# Patient Record
Sex: Male | Born: 1985 | Race: Black or African American | Hispanic: No | Marital: Single | Smoking: Current every day smoker
Health system: Southern US, Community
[De-identification: ages and names within clinical notes are randomized; demographics above are authoritative.]

## PROBLEM LIST (undated history)

## (undated) DIAGNOSIS — J45909 Unspecified asthma, uncomplicated: Secondary | ICD-10-CM

## (undated) DIAGNOSIS — F319 Bipolar disorder, unspecified: Secondary | ICD-10-CM

## (undated) DIAGNOSIS — F32A Depression, unspecified: Secondary | ICD-10-CM

## (undated) DIAGNOSIS — F329 Major depressive disorder, single episode, unspecified: Secondary | ICD-10-CM

---
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
## 2007-03-22 ENCOUNTER — Inpatient Hospital Stay: Payer: Self-pay | Admitting: Internal Medicine

## 2007-03-22 ENCOUNTER — Other Ambulatory Visit: Payer: Self-pay

## 2007-10-24 ENCOUNTER — Emergency Department: Payer: Self-pay | Admitting: Emergency Medicine

## 2011-01-13 ENCOUNTER — Emergency Department: Payer: Self-pay | Admitting: Emergency Medicine

## 2011-10-17 ENCOUNTER — Emergency Department: Payer: Self-pay | Admitting: Unknown Physician Specialty

## 2011-10-17 LAB — DRUG SCREEN, URINE
Amphetamines, Ur Screen: NEGATIVE (ref ?–1000)
Barbiturates, Ur Screen: NEGATIVE (ref ?–200)
Benzodiazepine, Ur Scrn: NEGATIVE (ref ?–200)
Cocaine Metabolite,Ur ~~LOC~~: NEGATIVE (ref ?–300)
MDMA (Ecstasy)Ur Screen: NEGATIVE (ref ?–500)
Methadone, Ur Screen: NEGATIVE (ref ?–300)
Phencyclidine (PCP) Ur S: POSITIVE (ref ?–25)
Tricyclic, Ur Screen: NEGATIVE (ref ?–1000)

## 2011-10-17 LAB — CBC
MCH: 30.1 pg (ref 26.0–34.0)
MCV: 89 fL (ref 80–100)
Platelet: 246 10*3/uL (ref 150–440)
RDW: 13.1 % (ref 11.5–14.5)
WBC: 8.4 10*3/uL (ref 3.8–10.6)

## 2011-10-17 LAB — URINALYSIS, COMPLETE
Bilirubin,UR: NEGATIVE
Glucose,UR: NEGATIVE mg/dL (ref 0–75)
Ketone: NEGATIVE
Nitrite: NEGATIVE
RBC,UR: NONE SEEN /HPF (ref 0–5)
Squamous Epithelial: NONE SEEN
WBC UR: <1 /HPF (ref 0–5)

## 2011-10-17 LAB — COMPREHENSIVE METABOLIC PANEL
Albumin: 4.5 g/dL (ref 3.4–5.0)
Alkaline Phosphatase: 63 U/L (ref 50–136)
Anion Gap: 11 (ref 7–16)
BUN: 5 mg/dL — ABNORMAL LOW (ref 7–18)
Bilirubin,Total: 0.8 mg/dL (ref 0.2–1.0)
Calcium, Total: 9.2 mg/dL (ref 8.5–10.1)
Co2: 22 mmol/L (ref 21–32)
Creatinine: 0.62 mg/dL (ref 0.60–1.30)
EGFR (Non-African Amer.): >60
Osmolality: 276 (ref 275–301)
Potassium: 4.1 mmol/L (ref 3.5–5.1)
SGOT(AST): 61 U/L — ABNORMAL HIGH (ref 15–37)
SGPT (ALT): 51 U/L
Sodium: 140 mmol/L (ref 136–145)
Total Protein: 8 g/dL (ref 6.4–8.2)

## 2011-10-17 LAB — CK TOTAL AND CKMB (NOT AT ARMC): CK-MB: 3.3 ng/mL (ref 0.5–3.6)

## 2011-10-17 LAB — ETHANOL
Ethanol %: 0.228 % — ABNORMAL HIGH (ref 0.000–0.080)
Ethanol: 228 mg/dL

## 2012-06-17 ENCOUNTER — Emergency Department: Payer: Self-pay | Admitting: Emergency Medicine

## 2013-02-02 ENCOUNTER — Emergency Department: Payer: Self-pay | Admitting: Emergency Medicine

## 2013-04-06 ENCOUNTER — Emergency Department: Payer: Self-pay | Admitting: Emergency Medicine

## 2014-09-15 ENCOUNTER — Emergency Department: Payer: Self-pay | Admitting: Emergency Medicine

## 2015-01-25 ENCOUNTER — Emergency Department (HOSPITAL_COMMUNITY): Payer: Self-pay

## 2015-01-25 ENCOUNTER — Emergency Department (HOSPITAL_COMMUNITY)
Admission: EM | Admit: 2015-01-25 | Discharge: 2015-01-25 | Disposition: A | Discharge status: Home / Self Care | Payer: Self-pay | Attending: Emergency Medicine | Admitting: Emergency Medicine

## 2015-01-25 ENCOUNTER — Encounter (HOSPITAL_COMMUNITY): Payer: Self-pay | Admitting: Emergency Medicine

## 2015-01-25 DIAGNOSIS — J45901 Unspecified asthma with (acute) exacerbation: Secondary | ICD-10-CM | POA: Insufficient documentation

## 2015-01-25 HISTORY — DX: Unspecified asthma, uncomplicated: J45.909

## 2015-01-25 MED ORDER — PREDNISONE 20 MG PO TABS: 40.0000 mg | ORAL_TABLET | Freq: Every day | ORAL | Status: DC

## 2015-01-25 MED ORDER — PREDNISONE 20 MG PO TABS
60.0000 mg | ORAL_TABLET | Freq: Once | ORAL | Status: AC
  Administered 2015-01-25: 60 mg via ORAL
  Filled 2015-01-25: qty 3

## 2015-01-25 MED ORDER — ALBUTEROL SULFATE HFA 108 (90 BASE) MCG/ACT IN AERS
2.0000 | INHALATION_SPRAY | RESPIRATORY_TRACT | Status: DC | PRN
  Administered 2015-01-25: 2 via RESPIRATORY_TRACT
  Filled 2015-01-25: qty 6.7

## 2015-01-25 MED ORDER — IPRATROPIUM-ALBUTEROL 0.5-2.5 (3) MG/3ML IN SOLN
3.0000 mL | Freq: Once | RESPIRATORY_TRACT | Status: AC
  Administered 2015-01-25: 3 mL via RESPIRATORY_TRACT
  Filled 2015-01-25: qty 3

## 2015-01-25 NOTE — ED Notes (Signed)
Pt reports cough last night. 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 States no inhalers. Pt noted to have inspiratory and expiratory wheezing.

## 2015-01-25 NOTE — Discharge Instructions (Signed)

## 2015-01-25 NOTE — ED Provider Notes (Signed)
CSN: [id number]     Arrival date & time 01/25/15  0930 History  This chart was scribed for non-physician practitioner, Teressa Lower, NP, working with Mancel Bale, MD, by Ronney Lion, ED Scribe. This patient was seen in room TR09C/TR09C and the patient's care was started at 9:41 AM.    No chief complaint on file.  The history is provided by the patient. No language interpreter was used.    HPI Comments: Omar [name] is a 29 y.o. male who presents to the Emergency Department complaining of a possible asthma exacerbation. Patient was at work when he started to have a severe coughing fit accompanied by sweating. He mentions that he also had a constant cough over the past 2 days. He states that beads of sweat form on his head whenever he has a coughing fit. Patient doesn't have an inhaler, but he states he did go to the hospital 2 months ago, where he was told he had acute asthma and was given a breathing treatment. Patient works with [employer] in [place], [place]. He denies any known fever.  No past medical history on file. No past surgical history on file. No family history on file. History  Substance Use Topics  . Smoking status: Not on file  . Smokeless tobacco: Not on file  . Alcohol Use: Not on file    Review of Systems  Constitutional: Positive for diaphoresis. Negative for fever.  Respiratory: Positive for cough.   All other systems reviewed and are negative.     Allergies  Review of patient's allergies indicates not on file.  Home Medications   Prior to Admission medications   Not on File   There were no vitals taken for this visit. Physical Exam  Constitutional: He is oriented to person, place, and time. He appears well-developed and well-nourished. No distress.  HENT:  Head: Normocephalic and atraumatic.  Right Ear: External ear normal.  Left Ear: External ear normal.  Mouth/Throat: Oropharynx is clear and moist.  Eyes: Conjunctivae and EOM are normal.  Neck:  Neck supple. No tracheal deviation present.  Cardiovascular: Normal rate.   Pulmonary/Chest: Effort normal. No respiratory distress. He has wheezes.  Musculoskeletal: Normal range of motion.  Neurological: He is alert and oriented to person, place, and time.  Skin: Skin is warm and dry.  Psychiatric: He has a normal mood and affect. His behavior is normal.  Nursing note and vitals reviewed.   ED Course  Procedures (including critical care time)  DIAGNOSTIC STUDIES: Oxygen Saturation is 97% on RA, noraml by my interpretation.    COORDINATION OF CARE: 9:42 AM - Discussed treatment plan with pt at bedside which includes breathing treatment and CXR, and pt agreed to plan.   Labs Review Labs Reviewed - No data to display  Imaging Review Dg Chest 2 View  01/25/2015   CLINICAL DATA:  Asthma  EXAM: CHEST  2 VIEW  COMPARISON:  None.  FINDINGS: Cardiomediastinal silhouette is unremarkable. No acute infiltrate or pleural effusion. No pulmonary edema. Bony thorax is unremarkable.  IMPRESSION: No active cardiopulmonary disease.   Electronically Signed   By: Natasha Mead M.D.   On: 01/25/2015 09:57     EKG Interpretation None      MDM   Final diagnoses:  Asthma exacerbation   Pt is doing better after treatment.no infection noted on x-ray. Pt sent home on prednisone and given and inhaler.   I personally performed the services described in this documentation, which was scribed in my  presence. The recorded information has been reviewed and is accurate.     Teressa Lower, NP 01/25/15 1040  Mancel Bale, MD 01/26/15 [phone]

## 2015-03-07 ENCOUNTER — Emergency Department (INDEPENDENT_AMBULATORY_CARE_PROVIDER_SITE_OTHER)
Admission: EM | Admit: 2015-03-07 | Discharge: 2015-03-07 | Disposition: A | Discharge status: Home / Self Care | Payer: Self-pay | Source: Home / Self Care | Attending: Emergency Medicine | Admitting: Emergency Medicine

## 2015-03-07 ENCOUNTER — Encounter (HOSPITAL_COMMUNITY): Payer: Self-pay | Admitting: Emergency Medicine

## 2015-03-07 DIAGNOSIS — J45901 Unspecified asthma with (acute) exacerbation: Secondary | ICD-10-CM

## 2015-03-07 MED ORDER — IPRATROPIUM-ALBUTEROL 0.5-2.5 (3) MG/3ML IN SOLN
RESPIRATORY_TRACT | Status: AC
  Filled 2015-03-07: qty 3

## 2015-03-07 MED ORDER — ALBUTEROL SULFATE HFA 108 (90 BASE) MCG/ACT IN AERS
INHALATION_SPRAY | RESPIRATORY_TRACT | Status: AC
  Filled 2015-03-07: qty 6.7

## 2015-03-07 MED ORDER — CETIRIZINE HCL 10 MG PO TABS: 10.0000 mg | ORAL_TABLET | Freq: Every day | ORAL | Status: DC

## 2015-03-07 MED ORDER — ALBUTEROL SULFATE HFA 108 (90 BASE) MCG/ACT IN AERS
2.0000 | INHALATION_SPRAY | Freq: Once | RESPIRATORY_TRACT | Status: AC
  Administered 2015-03-07: 2 via RESPIRATORY_TRACT

## 2015-03-07 MED ORDER — IPRATROPIUM-ALBUTEROL 0.5-2.5 (3) MG/3ML IN SOLN
3.0000 mL | Freq: Once | RESPIRATORY_TRACT | Status: AC
  Administered 2015-03-07: 3 mL via RESPIRATORY_TRACT

## 2015-03-07 MED ORDER — PREDNISONE 50 MG PO TABS: ORAL_TABLET | ORAL | Status: DC

## 2015-03-07 NOTE — Discharge Instructions (Signed)
You have asthma. It seems like animal dander is a trigger for her asthma. Take prednisone 1 pill daily for the next 5 days. Take Zyrtec 1 pill daily for the next month. Use the albuterol inhaler every 4 hours as needed for wheezing or cough. Follow-up as needed.

## 2015-03-07 NOTE — ED Provider Notes (Signed)
CSN: [id number]     Arrival date & time 03/07/15  1840 History   First MD Initiated Contact with Patient 03/07/15 2008     Chief Complaint  Patient presents with  . Asthma   (Consider location/radiation/quality/duration/timing/severity/associated sxs/prior Treatment) HPI He is a 29 year old man here for evaluation of asthma. He states he is diagnosed with asthma about 2 months ago. At that time he was given an albuterol inhaler. He states for the last 9 months he has had frequent coughing. He denies frank shortness of breath or wheezing. He works at an [employer], and states most of his coughing spells happen at work. About a month ago, he had another asthma attack was seen in the emergency room. That time he was placed on prednisone and given another inhaler. Today, he has had frequent coughing at work. He is here because he is out of his albuterol. He also reports that after an asthma attack at work, he will sometimes have vomiting with eating.  Past Medical History  Diagnosis Date  . Asthma    History reviewed. No pertinent past surgical history. History reviewed. No pertinent family history. History  Substance Use Topics  . Smoking status: Current Every Day Smoker  . Smokeless tobacco: Not on file  . Alcohol Use: No    Review of Systems As in history of present illness Allergies  Review of patient's allergies indicates no known allergies.  Home Medications   Prior to Admission medications   Medication Sig Start Date End Date Taking? Authorizing Provider  cetirizine (ZYRTEC) 10 MG tablet Take 1 tablet (10 mg total) by mouth daily. 03/07/15   Charm Rings, MD  predniSONE (DELTASONE) 50 MG tablet Take 1 pill daily for 5 days. 03/07/15   Charm Rings, MD   BP 124/85 mmHg  Pulse 88  Temp(Src) 97.9 F (36.6 C) (Oral)  Resp 18  SpO2 95% Physical Exam  Constitutional: He is oriented to person, place, and time. He appears well-developed and well-nourished. No distress.  Neck:  Neck supple.  Cardiovascular: Normal rate, regular rhythm and normal heart sounds.   No murmur heard. Pulmonary/Chest: Effort normal. No respiratory distress. He has wheezes (diffuse expiratory wheezes). He has no rales.  Diffuse rhonchi  Neurological: He is alert and oriented to person, place, and time.    ED Course  Procedures (including critical care time) Labs Review Labs Reviewed - No data to display  Imaging Review No results found.   MDM   1. Asthma exacerbation    DuoNeb 2.5-0.5 mg given. Albuterol MDI 2 puffs given.  Lungs are much improved. He has a rare wheeze. I suspect animal dander is a trigger for his asthma. Discharge with albuterol inhaler. Prescription for prednisone for 5 days. Prescription for cetirizine. Letter for work provided. Follow-up as needed.   Charm Rings, MD 03/07/15 2046

## 2015-04-08 ENCOUNTER — Encounter (HOSPITAL_COMMUNITY): Payer: Self-pay | Admitting: *Deleted

## 2015-04-08 ENCOUNTER — Emergency Department (HOSPITAL_COMMUNITY): Payer: Self-pay

## 2015-04-08 ENCOUNTER — Emergency Department (HOSPITAL_COMMUNITY)
Admission: EM | Admit: 2015-04-08 | Discharge: 2015-04-08 | Disposition: A | Discharge status: Home / Self Care | Payer: Self-pay | Attending: Emergency Medicine | Admitting: Emergency Medicine

## 2015-04-08 DIAGNOSIS — J45909 Unspecified asthma, uncomplicated: Secondary | ICD-10-CM | POA: Insufficient documentation

## 2015-04-08 DIAGNOSIS — B349 Viral infection, unspecified: Secondary | ICD-10-CM | POA: Insufficient documentation

## 2015-04-08 DIAGNOSIS — Z79899 Other long term (current) drug therapy: Secondary | ICD-10-CM | POA: Insufficient documentation

## 2015-04-08 DIAGNOSIS — Z720 Tobacco use: Secondary | ICD-10-CM | POA: Insufficient documentation

## 2015-04-08 LAB — COMPREHENSIVE METABOLIC PANEL
ALBUMIN: 4.2 g/dL (ref 3.5–5.0)
ALT: 33 U/L (ref 17–63)
ANION GAP: 13 (ref 5–15)
AST: 48 U/L — AB (ref 15–41)
Alkaline Phosphatase: 71 U/L (ref 38–126)
BILIRUBIN TOTAL: 1.1 mg/dL (ref 0.3–1.2)
BUN: 13 mg/dL (ref 6–20)
CALCIUM: 8.8 mg/dL — AB (ref 8.9–10.3)
CO2: 19 mmol/L — ABNORMAL LOW (ref 22–32)
Chloride: 99 mmol/L — ABNORMAL LOW (ref 101–111)
Creatinine, Ser: 1.12 mg/dL (ref 0.61–1.24)
Glucose, Bld: 128 mg/dL — ABNORMAL HIGH (ref 65–99)
Potassium: 3.3 mmol/L — ABNORMAL LOW (ref 3.5–5.1)
SODIUM: 131 mmol/L — AB (ref 135–145)
TOTAL PROTEIN: 7.2 g/dL (ref 6.5–8.1)

## 2015-04-08 LAB — CBC
HEMATOCRIT: 44.5 % (ref 39.0–52.0)
Hemoglobin: 15 g/dL (ref 13.0–17.0)
MCH: 27.9 pg (ref 26.0–34.0)
MCHC: 33.7 g/dL (ref 30.0–36.0)
MCV: 82.9 fL (ref 78.0–100.0)
Platelets: 170 10*3/uL (ref 150–400)
RBC: 5.37 MIL/uL (ref 4.22–5.81)
RDW: 12.8 % (ref 11.5–15.5)
WBC: 5.7 10*3/uL (ref 4.0–10.5)

## 2015-04-08 LAB — URINALYSIS, ROUTINE W REFLEX MICROSCOPIC
Bilirubin Urine: NEGATIVE
Glucose, UA: NEGATIVE mg/dL
Leukocytes, UA: NEGATIVE
Nitrite: NEGATIVE
PROTEIN: 30 mg/dL — AB
Specific Gravity, Urine: 1.025 (ref 1.005–1.030)
UROBILINOGEN UA: 0.2 mg/dL (ref 0.0–1.0)
pH: 5.5 (ref 5.0–8.0)

## 2015-04-08 LAB — URINE MICROSCOPIC-ADD ON

## 2015-04-08 LAB — I-STAT CG4 LACTIC ACID, ED
LACTIC ACID, VENOUS: 0.93 mmol/L (ref 0.5–2.0)
LACTIC ACID, VENOUS: 0.41 mmol/L — AB (ref 0.5–2.0)

## 2015-04-08 MED ORDER — DEXTROSE 5 % IV SOLN
1.0000 g | Freq: Once | INTRAVENOUS | Status: AC
  Administered 2015-04-08: 1 g via INTRAVENOUS
  Filled 2015-04-08: qty 10

## 2015-04-08 MED ORDER — SODIUM CHLORIDE 0.9 % IV BOLUS (SEPSIS)
1000.0000 mL | Freq: Once | INTRAVENOUS | Status: AC
  Administered 2015-04-08: 1000 mL via INTRAVENOUS

## 2015-04-08 MED ORDER — ACETAMINOPHEN 325 MG PO TABS
650.0000 mg | ORAL_TABLET | Freq: Once | ORAL | Status: AC | PRN
  Administered 2015-04-08: 650 mg via ORAL
  Filled 2015-04-08: qty 2

## 2015-04-08 MED ORDER — PROMETHAZINE HCL 25 MG PO TABS: 25.0000 mg | ORAL_TABLET | Freq: Four times a day (QID) | ORAL | Status: DC | PRN

## 2015-04-08 MED ORDER — ALBUTEROL SULFATE HFA 108 (90 BASE) MCG/ACT IN AERS
2.0000 | INHALATION_SPRAY | RESPIRATORY_TRACT | Status: DC | PRN
  Administered 2015-04-08: 2 via RESPIRATORY_TRACT
  Filled 2015-04-08: qty 6.7

## 2015-04-08 NOTE — ED Provider Notes (Signed)
CSN: [id number]     Arrival date & time 04/08/15  1219 History   First MD Initiated Contact with Patient 04/08/15 1234     Chief Complaint  Patient presents with  . Cough  . Fever     (Consider location/radiation/quality/duration/timing/severity/associated sxs/prior Treatment) Patient is a 29 y.o. Omar [name] presenting with cough and fever. The history is provided by the patient.  Cough Cough characteristics:  Productive Sputum characteristics:  Yellow Associated symptoms: fever, headaches and sore throat   Fever Associated symptoms: cough, diarrhea, headaches, nausea, sore throat and vomiting    for the last few days patient has had fever with some production. States he's felt weak all over. Also has had nausea vomiting diarrhea. No definite sick contacts. States he is vomited and had watery diarrhea. He has a dull headache. States he feels fatigued all over. He is otherwise healthy. He does smoke. No dysuria.  Past Medical History  Diagnosis Date  . Asthma    History reviewed. No pertinent past surgical history. History reviewed. No pertinent family history. Social History  Substance Use Topics  . Smoking status: Current Every Day Smoker  . Smokeless tobacco: None  . Alcohol Use: No    Review of Systems  Constitutional: Positive for fever and appetite change.  HENT: Positive for sore throat.   Respiratory: Positive for cough.   Gastrointestinal: Positive for nausea, vomiting and diarrhea. Negative for abdominal pain.  Genitourinary: Negative for flank pain.  Musculoskeletal: Negative for back pain.  Skin: Negative for wound.  Neurological: Positive for headaches.  Hematological: Negative for adenopathy.      Allergies  Review of patient's allergies indicates no known allergies.  Home Medications   Prior to Admission medications   Medication Sig Start Date End Date Taking? Authorizing Provider  albuterol (PROVENTIL HFA;VENTOLIN HFA) 108 (90 BASE) MCG/ACT inhaler  Inhale 1-2 puffs into the lungs every 4 (four) hours as needed for wheezing or shortness of breath.   Yes Historical Provider, MD  cetirizine (ZYRTEC) 10 MG tablet Take 1 tablet (10 mg total) by mouth daily. 03/07/15  Yes Charm Rings, MD  ibuprofen (ADVIL,MOTRIN) 200 MG tablet Take 400 mg by mouth every 6 (six) hours as needed (pain).   Yes Historical Provider, MD   BP 119/62 mmHg  Pulse 101  Temp(Src) 99.1 F (37.3 C) (Oral)  Resp 18  SpO2 97% Physical Exam  Constitutional: He is oriented to person, place, and time. He appears well-developed and well-nourished.  HENT:  Head: Atraumatic.  Slight posterior pharyngeal erythema without exudate.  Neck: Neck supple.  No meningeal signs  Cardiovascular:  Moderate tachycardia  Pulmonary/Chest: Effort normal.  Few scattered wheezes on right and couple rhonchi on the base on the left.  Abdominal: Soft. There is no tenderness.  Neurological: He is alert and oriented to person, place, and time.  Skin: Skin is warm.    ED Course  Procedures (including critical care time) Labs Review Labs Reviewed  COMPREHENSIVE METABOLIC PANEL - Abnormal; Notable for the following:    Sodium 131 (*)    Potassium 3.3 (*)    Chloride 99 (*)    CO2 19 (*)    Glucose, Bld 128 (*)    Calcium 8.8 (*)    AST 48 (*)    All other components within normal limits  URINALYSIS, ROUTINE W REFLEX MICROSCOPIC (NOT AT [facility]) - Abnormal; Notable for the following:    Hgb urine dipstick TRACE (*)    Ketones, ur >80 (*)  Protein, ur 30 (*)    All other components within normal limits  I-STAT CG4 LACTIC ACID, ED - Abnormal; Notable for the following:    Lactic Acid, Venous 0.41 (*)    All other components within normal limits  URINE CULTURE  CULTURE, BLOOD (ROUTINE X 2)  CULTURE, BLOOD (ROUTINE X 2)  CBC  URINE MICROSCOPIC-ADD ON  I-STAT CG4 LACTIC ACID, ED    Imaging Review Dg Chest 2 View  04/08/2015   CLINICAL DATA:  Cough and shortness of breath for 1  week. History of smoking. History of asthma.  EXAM: CHEST  2 VIEW  COMPARISON:  06/Omar/2016  FINDINGS: The cardiac silhouette, mediastinal and hilar contours are within normal limits and stable. There are mild stable bronchitic type changes, likely related to smoking. No definite infiltrates or effusions. The bony thorax is intact.  IMPRESSION: Mild stable bronchitic changes, likely related to smoking. No infiltrates or effusions.   Electronically Signed   By: Rudie Meyer M.D.   On: 04/08/2015 13:27   I have personally reviewed and evaluated these images and lab results as part of my medical decision-making.   EKG Interpretation   Date/Time:  Friday April 08 2015 12:30:51 EDT Ventricular Rate:  146 PR Interval:  128 QRS Duration: 76 QT Interval:  352 QTC Calculation: 548 R Axis:   93 Text Interpretation:  Sinus tachycardia Rightward axis Abnormal ECG  Confirmed by Tunisha Ruland  MD, Harrold Donath [phone]) on 04/08/2015 12:44:01 PM      MDM   Final diagnoses:  Viral infection    Patient initially came in with a fever 103 and a heart rate of 130. Feels much better after fluids and will give breathing treatment here. Normal lactic acid. Heart rate has come down. Not hypoxic and awaiting. X-ray does not show focal findings on reexam there is just some mild diffuse wheezes. Will give inhaler. Has tolerated also. Will discharge home. Has had nausea vomiting diarrhea also.    Benjiman Core, MD 04/08/15 [phone]

## 2015-04-08 NOTE — ED Notes (Signed)
Patient transported to X-ray 

## 2015-04-08 NOTE — ED Notes (Signed)
Pt reports having a productive cough since Monday with yellow sputum. Reports fever, fatigue, lack of appetite and n/v yesterday. Temp 103.2 at triage, HR 145.

## 2015-04-08 NOTE — Discharge Instructions (Signed)

## 2015-04-09 LAB — URINE CULTURE
Culture: NO GROWTH
Special Requests: NORMAL

## 2015-04-13 LAB — CULTURE, BLOOD (ROUTINE X 2)
Culture: NO GROWTH
Culture: NO GROWTH

## 2016-04-01 IMAGING — CR DG CHEST 2V
2 series · 2 of 2 positions shown · non-contrast
Comparison: 01/25/2015

CLINICAL DATA: Cough and shortness of breath for 1 week. History of
smoking. History of asthma.

EXAM:
CHEST  2 VIEW

[chest pa]
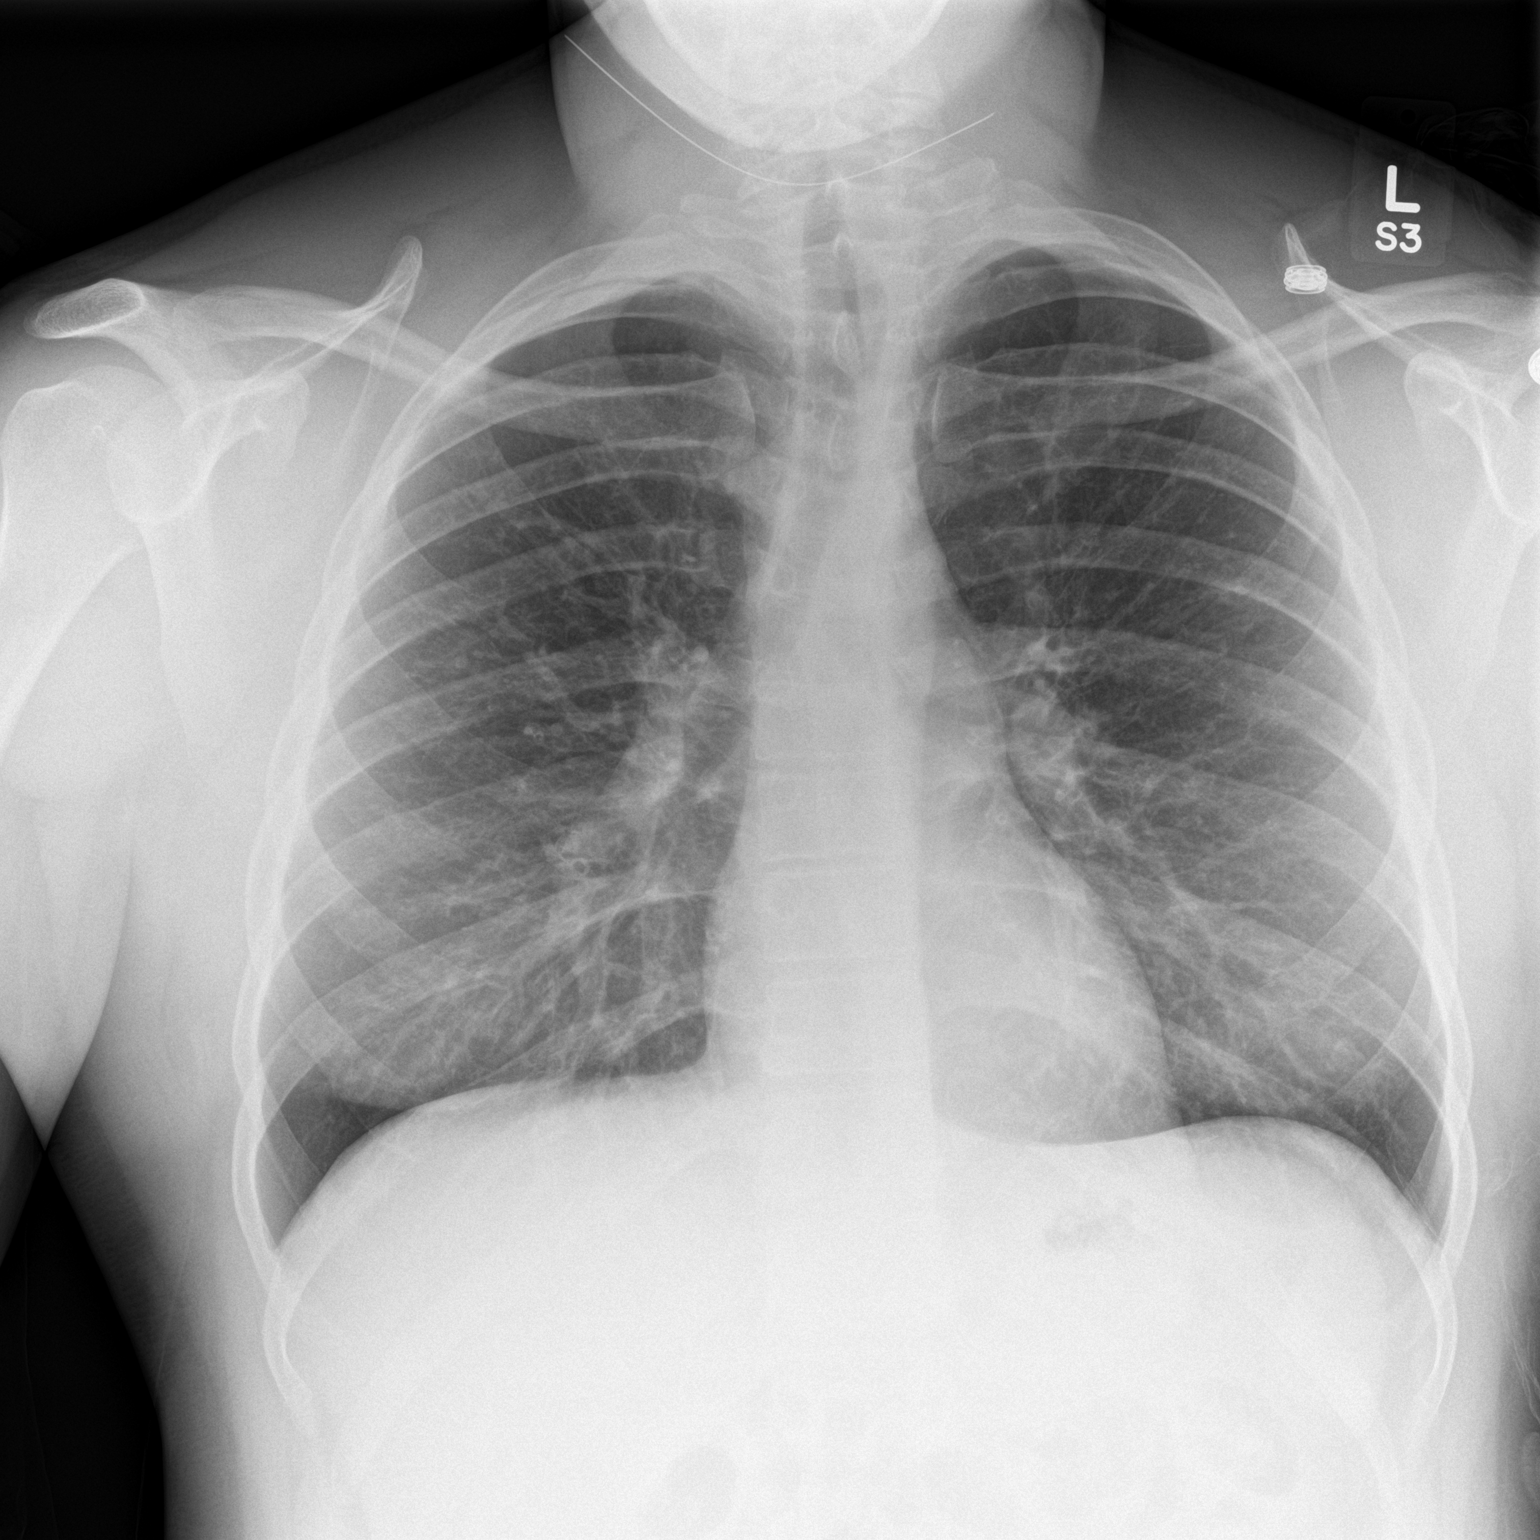

[chest lat]
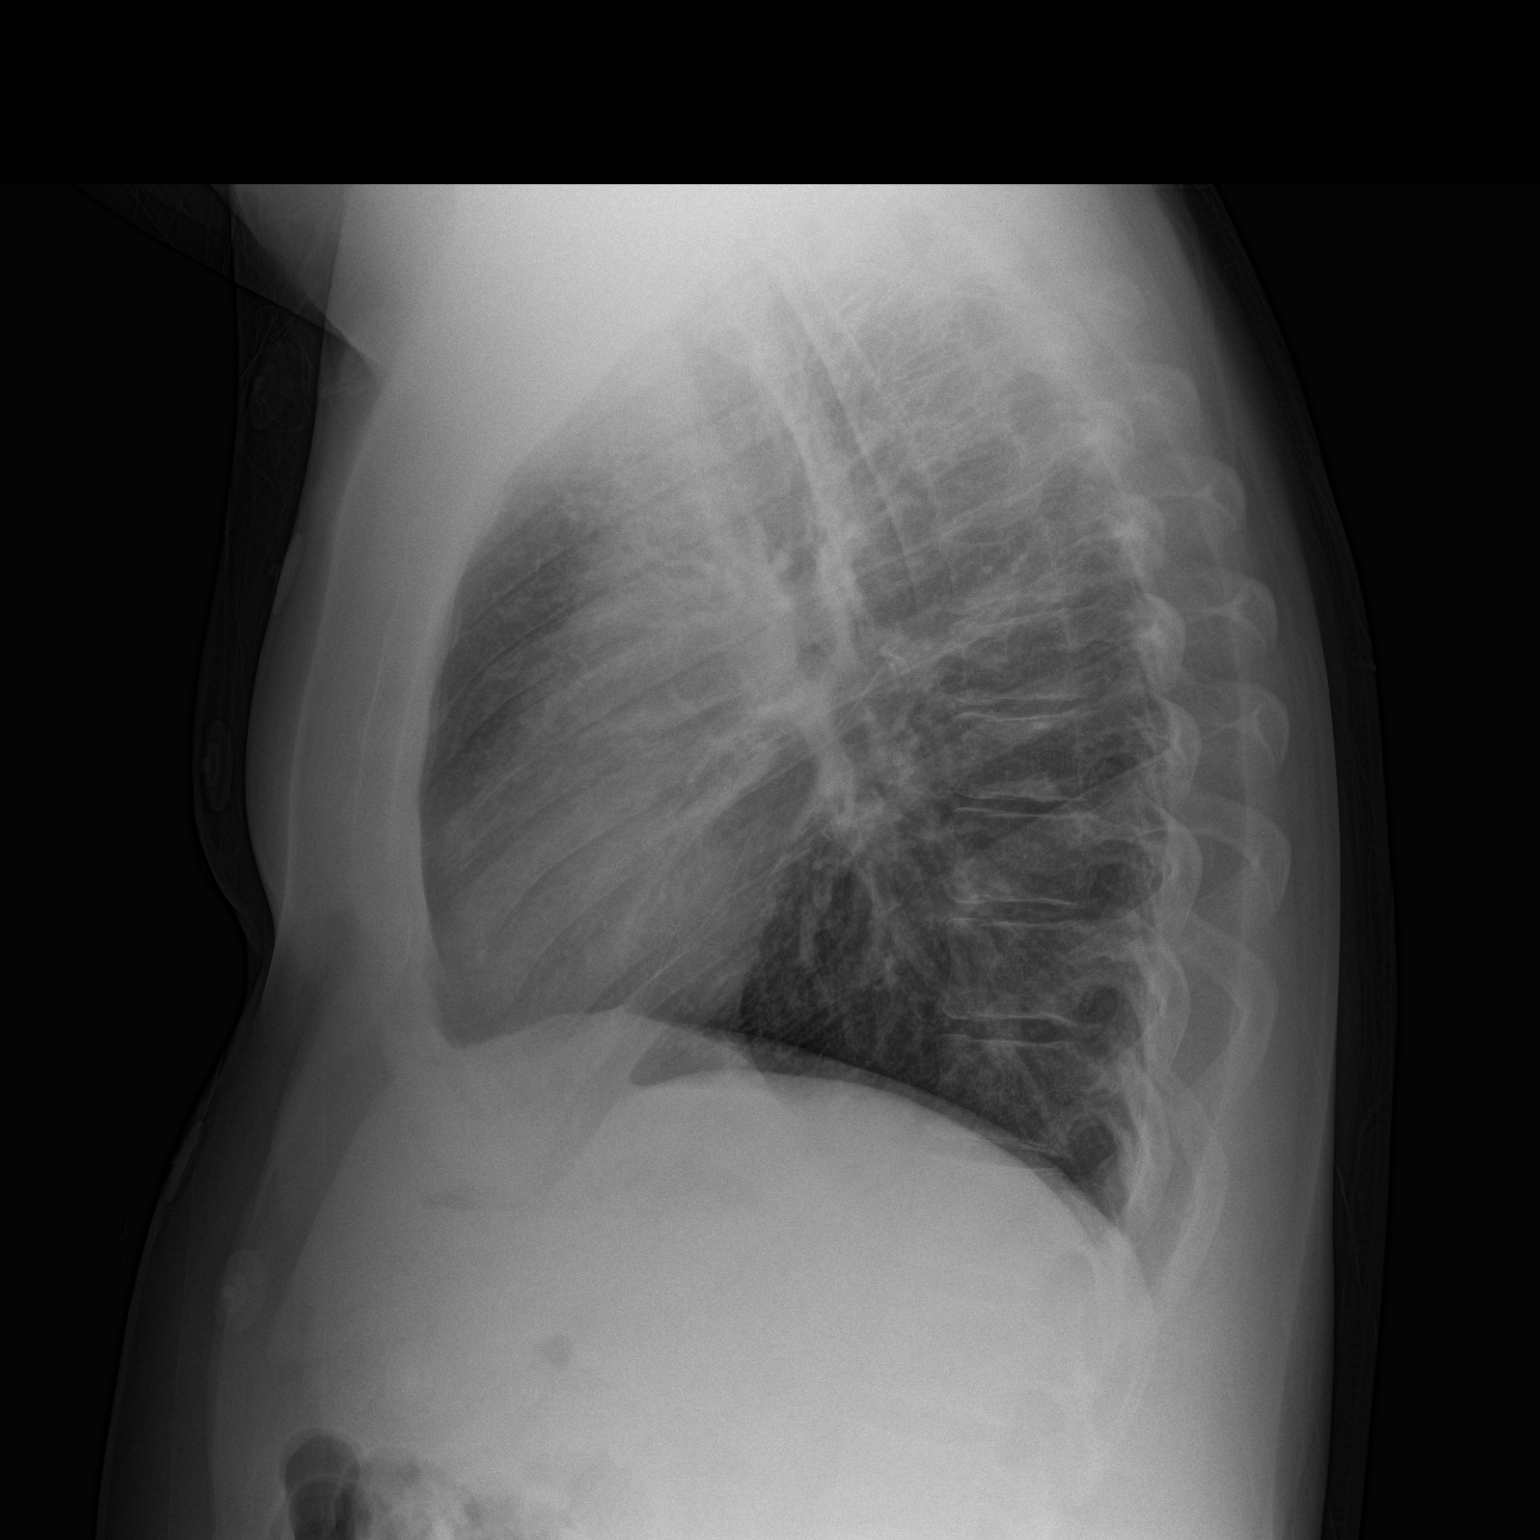

[2 of 2 positions shown; findings below may reference images not displayed]

FINDINGS: The cardiac silhouette, mediastinal and hilar contours are within
normal limits and stable. There are mild stable bronchitic type
changes, likely related to smoking. No definite infiltrates or
effusions. The bony thorax is intact.
IMPRESSION: Mild stable bronchitic changes, likely related to smoking. No
infiltrates or effusions.

## 2018-05-02 ENCOUNTER — Other Ambulatory Visit: Payer: Self-pay

## 2018-05-02 ENCOUNTER — Encounter (HOSPITAL_COMMUNITY): Payer: Self-pay | Admitting: Emergency Medicine

## 2018-05-02 ENCOUNTER — Emergency Department (HOSPITAL_COMMUNITY)
Admission: EM | Admit: 2018-05-02 | Discharge: 2018-05-02 | Disposition: A | Discharge status: Home / Self Care | Payer: Self-pay | Attending: Emergency Medicine | Admitting: Emergency Medicine

## 2018-05-02 DIAGNOSIS — R51 Headache: Secondary | ICD-10-CM | POA: Insufficient documentation

## 2018-05-02 DIAGNOSIS — S0990XA Unspecified injury of head, initial encounter: Secondary | ICD-10-CM

## 2018-05-02 DIAGNOSIS — F17200 Nicotine dependence, unspecified, uncomplicated: Secondary | ICD-10-CM | POA: Insufficient documentation

## 2018-05-02 DIAGNOSIS — R519 Headache, unspecified: Secondary | ICD-10-CM

## 2018-05-02 DIAGNOSIS — Z79899 Other long term (current) drug therapy: Secondary | ICD-10-CM | POA: Insufficient documentation

## 2018-05-02 DIAGNOSIS — Y9389 Activity, other specified: Secondary | ICD-10-CM | POA: Insufficient documentation

## 2018-05-02 DIAGNOSIS — J45909 Unspecified asthma, uncomplicated: Secondary | ICD-10-CM | POA: Insufficient documentation

## 2018-05-02 DIAGNOSIS — S098XXA Other specified injuries of head, initial encounter: Secondary | ICD-10-CM | POA: Insufficient documentation

## 2018-05-02 DIAGNOSIS — Y9289 Other specified places as the place of occurrence of the external cause: Secondary | ICD-10-CM | POA: Insufficient documentation

## 2018-05-02 DIAGNOSIS — Y990 Civilian activity done for income or pay: Secondary | ICD-10-CM | POA: Insufficient documentation

## 2018-05-02 HISTORY — DX: Major depressive disorder, single episode, unspecified: F32.9

## 2018-05-02 HISTORY — DX: Bipolar disorder, unspecified: F31.9

## 2018-05-02 HISTORY — DX: Depression, unspecified: F32.A

## 2018-05-02 MED ORDER — KETOROLAC TROMETHAMINE 15 MG/ML IJ SOLN
15.0000 mg | Freq: Once | INTRAMUSCULAR | Status: AC
  Administered 2018-05-02: 15 mg via INTRAVENOUS
  Filled 2018-05-02: qty 1

## 2018-05-02 MED ORDER — METOCLOPRAMIDE HCL 5 MG/ML IJ SOLN
10.0000 mg | Freq: Once | INTRAMUSCULAR | Status: AC
  Administered 2018-05-02: 10 mg via INTRAVENOUS
  Filled 2018-05-02: qty 2

## 2018-05-02 MED ORDER — SODIUM CHLORIDE 0.9 % IV BOLUS
1000.0000 mL | Freq: Once | INTRAVENOUS | Status: AC
  Administered 2018-05-02: 1000 mL via INTRAVENOUS

## 2018-05-02 MED ORDER — DIPHENHYDRAMINE HCL 12.5 MG/5ML PO ELIX
12.5000 mg | ORAL_SOLUTION | Freq: Once | ORAL | Status: AC
  Administered 2018-05-02: 12.5 mg via ORAL
  Filled 2018-05-02: qty 10

## 2018-05-02 NOTE — ED Notes (Signed)
ED Provider at bedside. 

## 2018-05-02 NOTE — ED Notes (Signed)
Called pt's name for triage for the second time. Triage RN notified.
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
## 2018-05-02 NOTE — ED Notes (Signed)
Pt given turkey sandwich
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
## 2018-05-02 NOTE — Discharge Instructions (Addendum)
Please return to the Emergency Department for any new or worsening symptoms or if your symptoms do not improve. Please be sure to follow up with your Primary Care Physician as soon as possible regarding your visit today. If you do not have a Primary Doctor please use the resources below to establish one.  Contact a doctor if: Your symptoms are not helped by medicine. You have a headache that feels different than the other headaches. You feel sick to your stomach (nauseous) or you throw up (vomit). You have a fever. Get help right away if: Your headache becomes really bad. You keep throwing up. You have a stiff neck. You have trouble seeing. You have trouble speaking. You have pain in the eye or ear. Your muscles are weak or you lose muscle control. You lose your balance or have trouble walking. You feel like you will pass out (faint) or you pass out. You have confusion. Get help right away if: You have: A very bad (severe) headache that is not helped by medicine. Trouble walking or weakness in your arms and legs. Clear or bloody fluid coming from your nose or ears. Changes in your seeing (vision). Jerky movements that you cannot control (seizure). You throw up (vomit). Your symptoms get worse. You lose balance. Your speech is slurred. You pass out. You are sleepier and have trouble staying awake. The black centers of your eyes (pupils) change in size.  RESOURCE GUIDE  Chronic Pain Problems: Contact [facility]  [phone] Patients need to be referred by their primary care doctor.  Insufficient Money for Medicine: Contact United Way:  call "211" or [facility] [phone].  No Primary Care Doctor: Call Health Connect  [phone] - can help you locate a primary care doctor that  accepts your insurance, provides certain services, etc. Physician Referral Service- [phone]  Agencies that provide inexpensive medical care: [facility]  [phone] [facility]  [phone] [facility]  [phone] [facility]  [phone] [facility]  [phone] [facility]  [phone]  Medicaid-accepting [facility] Providers: [facility]- [address]  [phone], Mon-Fri 9am-7pm, Sat 9am-1pm [facility]- [address] [place], [address] [place]  [phone] [facility]- [address] [place]  [phone] [facility]- [address]  [phone] Renaye Rakers- [address] [place], [address], [phone]  Only accepts [place] patients after they have their name  applied to their card  Self Pay (no insurance) in [facility]: Sickle Cell Patients: Dr Willey Blade, [facility]  [address] [place], [phone] [facility]- [address] [place]  [phone]       [facility] [place]- [address]       -     [facility]- see information above (Speak to [employer] if you do not have insurance)       -  [facility]- [address] [place], [phone]       -  [facility]- [address],  [phone]       -  [facility]- [address] [phone]       -  Dr Julio Sicks-  [address], [place], [phone]       -  [facility]- [address], [phone]       -  [facility] [place]- [address], [phone], also [address], [phone]       -    [facility]- [address] [place], [phone], 1st & 3rd Saturday   every month, 10am-1pm  1) Find a Doctor and Pay Out of Pocket Although you won't have to find out who is covered by your insurance plan, it is a good idea to ask around and get recommendations. You will then need to call the office and see if the doctor you have chosen will accept you as a new patient and what types of options they offer for  patients who are self-pay. Some doctors offer discounts or will set up payment plans for their patients who do not have insurance, but you will need to ask so you aren't surprised when you get to your appointment.  2) Contact Your Local Health Department Not all health departments have doctors that can see patients for sick visits, but many do, so it is worth a call to see if yours does. If you don't know where your local health department is, you can check in your phone book. The CDC also has a tool to help you locate your state's health department, and many state websites also have listings of all of their local health departments.  3) Find a Walk-in Clinic If your illness is not likely to be very severe or complicated, you may want to try a walk in clinic. These are popping up all over the country in pharmacies, drugstores, and shopping centers. They're usually staffed by nurse practitioners or physician assistants that have been trained to treat common illnesses and complaints. They're usually fairly quick and inexpensive. However, if you have serious medical issues or chronic medical problems, these are probably not your best option  STD Testing [facility] [place], STD Clinic, [address], [place], phone [phone] or [phone].  Monday - Friday, call for an appointment. [facility] [place], STD Clinic, [place], [place], phone [phone] or [phone].  Monday - Friday, call for an appointment.  Abuse/Neglect: [facility] Child Abuse Hotline [phone](336) (602) 111-1664 [facility] Hotline 559-477-2040905 [phone] (After Hours)  Emergency Shelter:  [facility] [phone](336) [phone]  Maternity Homes: [facility] [phone]) [phone] [facility] [phone]) [phone]  MRSA Hotline #:   303 646 6555671 840 4607  [facility] Resources  [facility] [place]  United Way  [facility] [address]                 [address]         [address]  [place]                                               [place]                              [place] Phone:  [phone]                                  Phone:  [phone]                   Phone:  [place], [place]- [phone]       -     [facility] in [place], [address]                                  [place] [phone] or [phone] (After Hours)   [place]  Substance Abuse Resources: Alcohol and Drug Services  954-709-3216 [place] (732)326-6607 [facility] [place] Chinita Pester (548) 284-0815 Residential & Outpatient Substance Abuse Program  [phone]  Psychological Services: [facility]  [phone] [place]  [place], [place]. [address], [place], [place]: [phone] or [phone], PicCapture.uy  Dental Assistance  If unable to pay or uninsured, contact:  [facility] or [facility]. to become qualified for the adult dental clinic.  Patients with Medicaid: [facility] [phone] [address], [place] [address], [phone]  If unable to pay, or uninsured, contact [facility] [phone]) or [place] [phone] in [place], [place] in [facility]) to become qualified for the adult dental clinic   Other [place], [place], [place], [address], [place], [place], 2nd and 4th Thursday of the month at 6:30am.  10 clients each day by appointment, can sometimes see walk-in patients if someone does not show for an  appointment. [facility]- [address] [place]ard D[place]er [place], [place], [address], [place], [place], [place], [address], [place] Department- [phone] [facility] [phone]

## 2019-04-14 ENCOUNTER — Emergency Department
Admission: EM | Admit: 2019-04-14 | Discharge: 2019-04-14 | Disposition: A | Discharge status: Home / Self Care | Payer: Self-pay | Attending: Emergency Medicine | Admitting: Emergency Medicine

## 2019-04-14 ENCOUNTER — Encounter: Payer: Self-pay | Admitting: Emergency Medicine

## 2019-04-14 ENCOUNTER — Other Ambulatory Visit: Payer: Self-pay

## 2019-04-14 DIAGNOSIS — L0291 Cutaneous abscess, unspecified: Secondary | ICD-10-CM

## 2019-04-14 DIAGNOSIS — L0231 Cutaneous abscess of buttock: Secondary | ICD-10-CM | POA: Insufficient documentation

## 2019-04-14 DIAGNOSIS — F17210 Nicotine dependence, cigarettes, uncomplicated: Secondary | ICD-10-CM | POA: Insufficient documentation

## 2019-04-14 DIAGNOSIS — J45909 Unspecified asthma, uncomplicated: Secondary | ICD-10-CM | POA: Insufficient documentation

## 2019-04-14 DIAGNOSIS — Z79899 Other long term (current) drug therapy: Secondary | ICD-10-CM | POA: Insufficient documentation

## 2019-04-14 MED ORDER — CLINDAMYCIN PHOSPHATE 600 MG/4ML IJ SOLN
600.0000 mg | Freq: Once | INTRAMUSCULAR | Status: AC
  Administered 2019-04-14: 600 mg via INTRAMUSCULAR
  Filled 2019-04-14: qty 4

## 2019-04-14 MED ORDER — CLINDAMYCIN HCL 300 MG PO CAPS: 300.0000 mg | ORAL_CAPSULE | Freq: Four times a day (QID) | ORAL | 0 refills | Status: AC

## 2019-04-14 NOTE — ED Provider Notes (Signed)
[facility] Emergency Department Provider Note  ____________________________________________  Time seen: Approximately 10:44 AM  I have reviewed the triage vital signs and the nursing notes.   HISTORY  Chief Complaint Abscess    HPI Omar [name] is a 33 y.o. male that presents to the the emergency department for evaluation of abscess to left buttocks for 3 days.  Patient states that he thought area was a mosquito bite.  He [facility] over the [facility] and has been applying these. Area has some slight drainage. He is unsure of fever.  He has never had an abscess before.   Past Medical History:  Diagnosis Date  . Asthma   . Bipolar affective (HCC)   . Depression     There are no active problems to display for this patient.   History reviewed. No pertinent surgical history.  Prior to Admission medications   Medication Sig Start Date End Date Taking? Authorizing Provider  albuterol (PROVENTIL HFA;VENTOLIN HFA) 108 (90 BASE) MCG/ACT inhaler Inhale 1-2 puffs into the lungs every 4 (four) hours as needed for wheezing or shortness of breath.    [provider]  clindamycin (CLEOCIN) 300 MG capsule Take 1 capsule (300 mg total) by mouth 4 (four) times daily for 10 days. 04/14/19 04/24/19  Enid DerryWagner, Gisele Pack, PA-C  ibuprofen (ADVIL,MOTRIN) 200 MG tablet Take 400 mg by mouth every 6 (six) hours as needed (pain).    [provider]    Allergies Patient has no known allergies.  No family history on file.  Social History Social History   Tobacco Use  . Smoking status: Current Every Day Smoker  . Smokeless tobacco: Never Used  Substance Use Topics  . Alcohol use: No  . Drug use: Never     Review of Systems  Constitutional: No fever Respiratory: No SOB. Gastrointestinal: No abdominal pain.  No nausea, no vomiting.  Musculoskeletal: Negative for musculoskeletal pain. Skin: Negative for abrasions,  lacerations, ecchymosis.   ____________________________________________   PHYSICAL EXAM:  VITAL SIGNS: ED Triage Vitals  Enc Vitals Group     BP 04/14/19 0934 (!) 151/101     Pulse Rate 04/14/19 0934 91     Resp 04/14/19 0934 16     Temp 04/14/19 0934 99 F (37.2 C)     Temp Source 04/14/19 0934 Oral     SpO2 04/14/19 0934 96 %     Weight --      Height --      Head Circumference --      Peak Flow --      Pain Score 04/14/19 0935 10     Pain Loc --      Pain Edu? --      Excl. in GC? --      Constitutional: Alert and oriented. Well appearing and in no acute distress. Eyes: Conjunctivae are normal. PERRL. EOMI. Head: Atraumatic. ENT:      Ears:      Nose: No congestion/rhinnorhea.      Mouth/Throat: Mucous membranes are moist.  Neck: No stridor.  Cardiovascular: Normal rate, regular rhythm.  Good peripheral circulation. Respiratory: Normal respiratory effort without tachypnea or retractions. Lungs CTAB. Good air entry to the bases with no decreased or absent breath sounds. Musculoskeletal: Full range of motion to all extremities. No gross deformities appreciated. Neurologic:  Normal speech and language. No gross focal neurologic deficits are appreciated.  Skin:  Skin is warm, dry.  1 cm x 1  cm area of swelling and induration to right buttocks proximal to gluteal cleft with 2 inch surrounding erythema.  Minimal yellow drainage. Psychiatric: Mood and affect are normal. Speech and behavior are normal. Patient exhibits appropriate insight and judgement.   ____________________________________________   LABS (all labs ordered are listed, but only abnormal results are displayed)  Labs Reviewed - No data to display ____________________________________________  EKG   ____________________________________________  RADIOLOGY   No results found.  ____________________________________________    PROCEDURES  Procedure(s) performed:     Procedures    Medications  clindamycin (CLEOCIN) injection 600 mg (600 mg Intramuscular Given 04/14/19 1050)     ____________________________________________   INITIAL IMPRESSION / ASSESSMENT AND PLAN / ED COURSE  Pertinent labs & imaging results that were available during my care of the patient were reviewed by me and considered in my medical decision making (see chart for details).  Review of the [place] CSRS was performed in accordance of the [place] prior to dispensing any controlled drugs.   Patient's diagnosis is consistent with skin abscess.  Abscess has some minimal drainage, but I recommend further incision and drainage due to size.  Patient refuses incision and drainage.  He was given IM clindamycin and will leave [place].  Patient understands risks of worsening infection without incision and drainage.  Patient will be discharged home with prescriptions for clindamycin. Patient is to follow up with emergency department as directed. Patient is given ED precautions to return to the ED for any worsening or new symptoms.   Omar [name] was evaluated in Emergency Department on 04/14/2019 for the symptoms described in the history of present illness. He was evaluated in the context of the global COVID-19 pandemic, which necessitated consideration that the patient might be at risk for infection with the SARS-CoV-2 virus that causes COVID-19. Institutional protocols and algorithms that pertain to the evaluation of patients at risk for COVID-19 are in a state of rapid change based on information released by regulatory bodies including the CDC and federal and state organizations. These policies and algorithms were followed during the patient's care in the ED.  ____________________________________________  FINAL CLINICAL IMPRESSION(S) / ED DIAGNOSES  Final diagnoses:  Abscess      NEW MEDICATIONS STARTED DURING THIS VISIT:  ED Discharge Orders         Ordered     clindamycin (CLEOCIN) 300 MG capsule  4 times daily     04/14/19 1107              This chart was dictated using voice recognition software/Dragon. Despite best efforts to proofread, errors can occur which can change the meaning. Any change was purely unintentional.    Laban Emperor, PA-C 04/14/19 1342    Lavonia Drafts, MD 04/14/19 1344

## 2019-04-14 NOTE — ED Notes (Signed)
See triage note  Presents with possible abscess area to right buttocks  Has large red area with slight drainage

## 2019-04-14 NOTE — ED Triage Notes (Signed)
Has abscess on buttock.

## 2019-04-14 NOTE — Discharge Instructions (Signed)
I recommend that we further drain the abscess.  Please take warm baths and apply warm compresses tonight.  Please return the emergency department if worsening.

## 2019-07-19 ENCOUNTER — Other Ambulatory Visit: Payer: Self-pay

## 2019-07-19 ENCOUNTER — Emergency Department
Admission: EM | Admit: 2019-07-19 | Discharge: 2019-07-19 | Disposition: A | Discharge status: Home / Self Care | Payer: Self-pay | Attending: Emergency Medicine | Admitting: Emergency Medicine

## 2019-07-19 ENCOUNTER — Encounter: Payer: Self-pay | Admitting: Intensive Care

## 2019-07-19 DIAGNOSIS — Y9389 Activity, other specified: Secondary | ICD-10-CM | POA: Insufficient documentation

## 2019-07-19 DIAGNOSIS — S39012A Strain of muscle, fascia and tendon of lower back, initial encounter: Secondary | ICD-10-CM | POA: Insufficient documentation

## 2019-07-19 DIAGNOSIS — T148XXA Other injury of unspecified body region, initial encounter: Secondary | ICD-10-CM

## 2019-07-19 DIAGNOSIS — Y998 Other external cause status: Secondary | ICD-10-CM | POA: Insufficient documentation

## 2019-07-19 DIAGNOSIS — S61019A Laceration without foreign body of unspecified thumb without damage to nail, initial encounter: Secondary | ICD-10-CM | POA: Insufficient documentation

## 2019-07-19 DIAGNOSIS — F17210 Nicotine dependence, cigarettes, uncomplicated: Secondary | ICD-10-CM | POA: Insufficient documentation

## 2019-07-19 DIAGNOSIS — Z23 Encounter for immunization: Secondary | ICD-10-CM | POA: Insufficient documentation

## 2019-07-19 DIAGNOSIS — X500XXA Overexertion from strenuous movement or load, initial encounter: Secondary | ICD-10-CM | POA: Insufficient documentation

## 2019-07-19 DIAGNOSIS — Y9289 Other specified places as the place of occurrence of the external cause: Secondary | ICD-10-CM | POA: Insufficient documentation

## 2019-07-19 LAB — URINALYSIS, COMPLETE (UACMP) WITH MICROSCOPIC
Bacteria, UA: NONE SEEN
Bilirubin Urine: NEGATIVE
Glucose, UA: NEGATIVE mg/dL
Hgb urine dipstick: NEGATIVE
Ketones, ur: NEGATIVE mg/dL
Leukocytes,Ua: NEGATIVE
Nitrite: NEGATIVE
Protein, ur: NEGATIVE mg/dL
Specific Gravity, Urine: 1.021 (ref 1.005–1.030)
Squamous Epithelial / LPF: NONE SEEN (ref 0–5)
pH: 5 (ref 5.0–8.0)

## 2019-07-19 LAB — CBC
HCT: 44.7 % (ref 39.0–52.0)
Hemoglobin: 15.3 g/dL (ref 13.0–17.0)
MCH: 28 pg (ref 26.0–34.0)
MCHC: 34.2 g/dL (ref 30.0–36.0)
MCV: 81.7 fL (ref 80.0–100.0)
Platelets: 299 10*3/uL (ref 150–400)
RBC: 5.47 MIL/uL (ref 4.22–5.81)
RDW: 12.9 % (ref 11.5–15.5)
WBC: 8.4 10*3/uL (ref 4.0–10.5)
nRBC: 0 % (ref 0.0–0.2)

## 2019-07-19 LAB — COMPREHENSIVE METABOLIC PANEL
ALT: 44 U/L (ref 0–44)
AST: 30 U/L (ref 15–41)
Albumin: 4.5 g/dL (ref 3.5–5.0)
Alkaline Phosphatase: 54 U/L (ref 38–126)
Anion gap: 9 (ref 5–15)
BUN: 12 mg/dL (ref 6–20)
CO2: 24 mmol/L (ref 22–32)
Calcium: 9.4 mg/dL (ref 8.9–10.3)
Chloride: 104 mmol/L (ref 98–111)
Creatinine, Ser: 1.04 mg/dL (ref 0.61–1.24)
GFR calc Af Amer: >60 mL/min (ref >60)
GFR calc non Af Amer: >60 mL/min (ref >60)
Glucose, Bld: 116 mg/dL — ABNORMAL HIGH (ref 70–99)
Potassium: 3.9 mmol/L (ref 3.5–5.1)
Sodium: 137 mmol/L (ref 135–145)
Total Bilirubin: 0.6 mg/dL (ref 0.3–1.2)
Total Protein: 7.5 g/dL (ref 6.5–8.1)

## 2019-07-19 LAB — HEPATITIS PANEL, ACUTE
HCV Ab: NONREACTIVE
Hep A IgM: NONREACTIVE
Hep B C IgM: NONREACTIVE
Hepatitis B Surface Ag: NONREACTIVE

## 2019-07-19 MED ORDER — ORPHENADRINE CITRATE 30 MG/ML IJ SOLN
60.0000 mg | Freq: Two times a day (BID) | INTRAMUSCULAR | Status: DC
  Administered 2019-07-19: 60 mg via INTRAVENOUS

## 2019-07-19 MED ORDER — BACLOFEN 5 MG PO TABS: 5.0000 mg | ORAL_TABLET | Freq: Three times a day (TID) | ORAL | 0 refills | Status: DC | PRN

## 2019-07-19 MED ORDER — KETOROLAC TROMETHAMINE 30 MG/ML IJ SOLN
30.0000 mg | Freq: Once | INTRAMUSCULAR | Status: AC
  Administered 2019-07-19: 30 mg via INTRAVENOUS

## 2019-07-19 MED ORDER — ORPHENADRINE CITRATE 30 MG/ML IJ SOLN
60.0000 mg | Freq: Two times a day (BID) | INTRAMUSCULAR | Status: DC
  Filled 2019-07-19: qty 2

## 2019-07-19 MED ORDER — KETOROLAC TROMETHAMINE 30 MG/ML IJ SOLN
30.0000 mg | Freq: Once | INTRAMUSCULAR | Status: DC
  Filled 2019-07-19: qty 1

## 2019-07-19 MED ORDER — TETANUS-DIPHTH-ACELL PERTUSSIS 5-2.5-18.5 LF-MCG/0.5 IM SUSP
0.5000 mL | Freq: Once | INTRAMUSCULAR | Status: AC
  Administered 2019-07-19: 0.5 mL via INTRAMUSCULAR
  Filled 2019-07-19: qty 0.5

## 2019-07-19 MED ORDER — IBUPROFEN 600 MG PO TABS: 600.0000 mg | ORAL_TABLET | Freq: Four times a day (QID) | ORAL | 0 refills | Status: DC | PRN

## 2019-07-19 NOTE — Discharge Instructions (Addendum)
You can take a dose of ibuprofen tonight for pain and inflammation in your back.  Continue ibuprofen tomorrow.  Baclofen is a muscle relaxer.  Please do not drive or work while you are taking the baclofen.  You can take a dose of baclofen when you are done with work tomorrow night.  Please [facility] or the [place] [facility] for follow-up appointments.

## 2019-07-19 NOTE — ED Notes (Signed)
Pt reports lower back pain. Denies injury. Ambulatory to room with steady gait and NAD noted

## 2019-07-19 NOTE — ED Triage Notes (Signed)
Patient c/o lower back pain. No injury

## 2019-07-19 NOTE — ED Provider Notes (Signed)
[facility] Emergency Department Provider Note  ____________________________________________  Time seen: Approximately 11:55 AM  I have reviewed the triage vital signs and the nursing notes.   HISTORY  Chief Complaint Back Pain (lower)    HPI Omar [name] is a 33 y.o. male that presents to the emergency department for evaluation of mid to low back pain at work today.  Patient states that he cut himself while he was at work and jerked back due to the cut.  After incident, he started to have some mid back pain.   Patient also states that he was not able to donate plasma about 2 weeks ago because his blood work was abnormal.  He does not remember the details.  He recalls them stating that he should be checked for hepatitis.  Patient drinks alcohol regularly.  He denies any fever, vomiting, abdominal pain.  No urinary symptoms.  Past Medical History:  Diagnosis Date  . Asthma   . Bipolar affective (HCC)   . Depression     There are no active problems to display for this patient.   History reviewed. No pertinent surgical history.  Prior to Admission medications   Medication Sig Start Date End Date Taking? Authorizing Provider  albuterol (PROVENTIL HFA;VENTOLIN HFA) 108 (90 BASE) MCG/ACT inhaler Inhale 1-2 puffs into the lungs every 4 (four) hours as needed for wheezing or shortness of breath.    [provider]  Baclofen 5 MG TABS Take 5 mg by mouth 3 (three) times daily as needed. 07/19/19   Enid DerryWagner, Lollie Gunner, PA-C  ibuprofen (ADVIL) 600 MG tablet Take 1 tablet (600 mg total) by mouth every 6 (six) hours as needed. 07/19/19   Enid DerryWagner, Vaughn Beaumier, PA-C    Allergies Patient has no known allergies.  History reviewed. No pertinent family history.  Social History Social History   Tobacco Use  . Smoking status: Current Every Day Smoker    Types: Cigarettes  . Smokeless tobacco: Never Used  Substance Use Topics  . Alcohol use: Yes    Alcohol/week:  7.0 standard drinks    Types: 7 Cans of beer per week  . Drug use: Yes    Types: Marijuana     Review of Systems  Constitutional: No fever/chills Cardiovascular: No chest pain. Respiratory: No SOB. Gastrointestinal: No abdominal pain.  No nausea, no vomiting.  Musculoskeletal: Positive for back pain. Skin: Negative for rash, abrasions, lacerations, ecchymosis. Neurological: Negative for headaches   ____________________________________________   PHYSICAL EXAM:  VITAL SIGNS: ED Triage Vitals  Enc Vitals Group     BP 07/19/19 1122 (!) 155/101     Pulse Rate 07/19/19 1122 90     Resp 07/19/19 1122 14     Temp 07/19/19 1122 99 F (37.2 C)     Temp Source 07/19/19 1122 Oral     SpO2 07/19/19 1122 98 %     Weight 07/19/19 1123 210 lb (95.3 kg)     Height 07/19/19 1123 5\' 9"  (1.753 m)     Head Circumference --      Peak Flow --      Pain Score 07/19/19 1123 3     Pain Loc --      Pain Edu? --      Excl. in GC? --      Constitutional: Alert and oriented. Well appearing and in no acute distress. Eyes: Conjunctivae are normal. PERRL. EOMI. Head: Atraumatic. ENT:      Ears:      Nose:  No congestion/rhinnorhea.      Mouth/Throat: Mucous membranes are moist.  Neck: No stridor.  Cardiovascular: Normal rate, regular rhythm.  Good peripheral circulation. Respiratory: Normal respiratory effort without tachypnea or retractions. Lungs CTAB. Good air entry to the bases with no decreased or absent breath sounds. Gastrointestinal: Bowel sounds 4 quadrants. Soft and nontender to palpation. No guarding or rigidity. No palpable masses. No distention. No CVA tenderness. Musculoskeletal: Full range of motion to all extremities. No gross deformities appreciated. Mild tenderness to palpation to inferior thoracic spine and superior lumbar spine and paraspinal muscles. Strength equal in lower extremities. Normal gait.   Neurologic:  Normal speech and language. No gross focal neurologic  deficits are appreciated.  Skin:  Skin is warm, dry and intact. No rash noted.  Small abrasion to thumb. Psychiatric: Mood and affect are normal. Speech and behavior are normal. Patient exhibits appropriate insight and judgement.   ____________________________________________   LABS (all labs ordered are listed, but only abnormal results are displayed)  Labs Reviewed  COMPREHENSIVE METABOLIC PANEL - Abnormal; Notable for the following components:      Result Value   Glucose, Bld 116 (*)    All other components within normal limits  URINALYSIS, COMPLETE (UACMP) WITH MICROSCOPIC - Abnormal; Notable for the following components:   Color, Urine YELLOW (*)    APPearance CLEAR (*)    All other components within normal limits  CBC  HEPATITIS PANEL, ACUTE   ____________________________________________  EKG   ____________________________________________  RADIOLOGY   No results found.  ____________________________________________    PROCEDURES  Procedure(s) performed:    Procedures    Medications  orphenadrine (NORFLEX) injection 60 mg (60 mg Intravenous Given 07/19/19 1311)  Tdap (BOOSTRIX) injection 0.5 mL (0.5 mLs Intramuscular Given 07/19/19 1206)  ketorolac (TORADOL) 30 MG/ML injection 30 mg (30 mg Intravenous Given 07/19/19 1314)     ____________________________________________   INITIAL IMPRESSION / ASSESSMENT AND PLAN / ED COURSE  Pertinent labs & imaging results that were available during my care of the patient were reviewed by me and considered in my medical decision making (see chart for details).  Review of the [place] CSRS was performed in accordance of the [place] prior to dispensing any controlled drugs.   Patient's diagnosis is consistent with muscle strain.  Lab work and hepatitis panel was ordered, as patient states that he was unable to donate plasma a couple weeks ago because he thought something was abnormal on his blood work and remembers being told  something about hepatitis.  He denies any fever, shortness breath, chest pain, abdominal pain.  Lab work largely unremarkable.  Hepatitis panel is in process. Urinalysis non contributory for cystitis. Patient was instructed to follow-up with primary care and referrals were given.  Patient was given IM Toradol and Norflex for back pain with improvement of symptoms.  Tetanus shot was updated for the small cut to his thumb.  Patient will be discharged home with prescriptions for Motrin and baclofen. Patient is to follow up with primary care as directed. Patient is given ED precautions to return to the ED for any worsening or new symptoms.   Omar [name] was evaluated in Emergency Department on 07/19/2019 for the symptoms described in the history of present illness. He was evaluated in the context of the global COVID-19 pandemic, which necessitated consideration that the patient might be at risk for infection with the SARS-CoV-2 virus that causes COVID-19. Institutional protocols and algorithms that pertain to the evaluation of patients at  risk for COVID-19 are in a state of rapid change based on information released by regulatory bodies including the CDC and federal and state organizations. These policies and algorithms were followed during the patient's care in the ED.  ____________________________________________  FINAL CLINICAL IMPRESSION(S) / ED DIAGNOSES  Final diagnoses:  Muscle strain      NEW MEDICATIONS STARTED DURING THIS VISIT:  ED Discharge Orders         Ordered    ibuprofen (ADVIL) 600 MG tablet  Every 6 hours PRN     07/19/19 1322    Baclofen 5 MG TABS  3 times daily PRN     07/19/19 1322              This chart was dictated using voice recognition software/Dragon. Despite best efforts to proofread, errors can occur which can change the meaning. Any change was purely unintentional.    Enid Derry, PA-C 07/19/19 1501    Arnaldo Natal, MD 07/19/19 [phone]

## 2020-02-17 ENCOUNTER — Telehealth: Payer: Self-pay | Admitting: General Practice

## 2020-02-17 NOTE — Telephone Encounter (Signed)
Individual has been contacted regarding ED referral. Calls could not be completed. No further attempt to contact the individual will be made.

## 2020-03-21 ENCOUNTER — Ambulatory Visit: Payer: Self-pay | Attending: Critical Care Medicine

## 2020-03-21 DIAGNOSIS — Z23 Encounter for immunization: Secondary | ICD-10-CM

## 2020-03-21 NOTE — Progress Notes (Signed)
   Covid-19 Vaccination Clinic  Name:  Omar [name]    MRN: [id number] DOB: 04/26/1986  03/21/2020  [name] was observed post Covid-19 immunization for 15 minutes without incident. He was provided with Vaccine Information Sheet and instruction to access the V-Safe system.   [name] was instructed to call 911 with any severe reactions post vaccine: Marland Kitchen Difficulty breathing  . Swelling of face and throat  . A fast heartbeat  . A bad rash all over body  . Dizziness and weakness   Immunizations Administered    Name Date Dose VIS Date Route   Pfizer COVID-19 Vaccine 03/21/2020  2:54 PM 0.3 mL 10/07/2018 Intramuscular   Manufacturer: [employer], [employer]   Lot: [id number]   NDC: [address]-2

## 2020-04-18 ENCOUNTER — Ambulatory Visit: Payer: Self-pay

## 2020-05-02 ENCOUNTER — Ambulatory Visit: Payer: Self-pay

## 2022-10-18 ENCOUNTER — Other Ambulatory Visit: Payer: Self-pay

## 2022-10-18 ENCOUNTER — Emergency Department
Admission: EM | Admit: 2022-10-18 | Discharge: 2022-10-18 | Disposition: A | Discharge status: Home / Self Care | Payer: Medicaid Other | Attending: Emergency Medicine | Admitting: Emergency Medicine

## 2022-10-18 ENCOUNTER — Encounter: Payer: Self-pay | Admitting: Intensive Care

## 2022-10-18 ENCOUNTER — Emergency Department: Payer: Medicaid Other

## 2022-10-18 DIAGNOSIS — X58XXXA Exposure to other specified factors, initial encounter: Secondary | ICD-10-CM | POA: Insufficient documentation

## 2022-10-18 DIAGNOSIS — T148XXA Other injury of unspecified body region, initial encounter: Secondary | ICD-10-CM

## 2022-10-18 DIAGNOSIS — S76119A Strain of unspecified quadriceps muscle, fascia and tendon, initial encounter: Secondary | ICD-10-CM | POA: Diagnosis not present

## 2022-10-18 DIAGNOSIS — Z1152 Encounter for screening for COVID-19: Secondary | ICD-10-CM | POA: Insufficient documentation

## 2022-10-18 DIAGNOSIS — J069 Acute upper respiratory infection, unspecified: Secondary | ICD-10-CM

## 2022-10-18 DIAGNOSIS — R0981 Nasal congestion: Secondary | ICD-10-CM | POA: Diagnosis present

## 2022-10-18 LAB — RESP PANEL BY RT-PCR (RSV, FLU A&B, COVID)  RVPGX2
Influenza A by PCR: NEGATIVE
Influenza B by PCR: NEGATIVE
Resp Syncytial Virus by PCR: NEGATIVE
SARS Coronavirus 2 by RT PCR: NEGATIVE

## 2022-10-18 MED ORDER — BACLOFEN 10 MG PO TABS: 10.0000 mg | ORAL_TABLET | Freq: Three times a day (TID) | ORAL | 0 refills | Status: AC

## 2022-10-18 NOTE — Discharge Instructions (Signed)
Please go to the following website to schedule new (and existing) patient appointments:   [email or web address]   The following is a list of primary care offices in the area who are accepting new patients at this time.  Please reach out to one of them directly and let them know you would like to schedule an appointment to follow up on an Emergency Department visit, and/or to establish a new primary care provider (PCP).  There are likely other primary care clinics in the are who are accepting new patients, but this is an excellent place to start:  [facility] Lead physician: Dr Angela Bacigalupo [address] [place], [place] [address] [phone]  [facility] Lead Physician: Dr Krichna Sowles [address], [place], [place] [address] [phone]  [facility]  Lead Physician: Dr Megan Johnson [address], [place], [place] [address] [phone]  [facility] [place] [facility] Lead Physician: Dr Alex Karamalegos [address], [place], [place] [address] [phone]  [facility] [place] Lead Physician: Dr Laura Berglund [address], [place],  [address] [phone]   

## 2022-10-18 NOTE — ED Triage Notes (Addendum)
Patient c/o chest discomfort from congestion and lower abdominal discomfort intermittently. Denies N/V/D

## 2022-10-18 NOTE — ED Notes (Signed)
See triage note  Presents with some congestion,cough and slight abd /chest discomfort d/t sxs'  No fever

## 2022-10-18 NOTE — ED Provider Notes (Signed)
[facility] Provider Note    Event Date/Time   First MD Initiated Contact with Patient 10/18/22 1114     (approximate)   History   Abdominal Pain and Nasal Congestion   HPI  Omar [name] is a 37 y.o. male with history of bipolar and asthma presents emergency department with concerns of chest pain and abdominal pain, cough and congestion.  Patient states that he lifts a lot of boxes at work.  States he thinks he pulled a muscle.  No vomiting or diarrhea.  Just some abdominal pain along his muscles.  Thinks he is working too hard.  Denies fever or chills.  Works at [employer] a is concerned that he may have been exposed to [place] or influenza      Physical Exam   Triage Vital Signs: ED Triage Vitals  Enc Vitals Group     BP 10/18/22 0926 (!) 136/102     Pulse Rate 10/18/22 0926 (!) 105     Resp 10/18/22 0926 20     Temp 10/18/22 0926 98.9 F (37.2 C)     Temp Source 10/18/22 0926 Oral     SpO2 10/18/22 0926 96 %     Weight 10/18/22 0924 210 lb (95.3 kg)     Height 10/18/22 0924 '5\' 9"'$  (1.753 m)     Head Circumference --      Peak Flow --      Pain Score 10/18/22 0923 0     Pain Loc --      Pain Edu? --      Excl. in [place]? --     Most recent vital signs: Vitals:   10/18/22 0926 10/18/22 1055  BP: (!) 136/102 (!) 130/94  Pulse: (!) 105 99  Resp: 20 20  Temp: 98.9 F (37.2 C)   SpO2: 96% 97%     General: Awake, no distress.   CV:  Good peripheral perfusion. regular rate and  rhythm Resp:  Normal effort. Lungs cta Abd:  No distention.  Nontender Other:      ED Results / Procedures / Treatments   Labs (all labs ordered are listed, but only abnormal results are displayed) Labs Reviewed  RESP PANEL BY RT-PCR (RSV, FLU A&B, COVID)  [id number]     EKG     RADIOLOGY cxr    PROCEDURES:   Procedures   MEDICATIONS ORDERED IN ED: Medications - No data to display   IMPRESSION / MDM / [place] / ED COURSE  I  reviewed the triage vital signs and the nursing notes.                              Differential diagnosis includes, but is not limited to, COVID, influenza, RSV, acute bronchitis, muscle strain  Patient's presentation is most consistent with acute complicated illness / injury requiring diagnostic workup.   Chest x-ray independently reviewed and interpreted by me as being negative  Respiratory panel ordered  Respiratory panel is reassuring  Did explain the findings to the patient.  Feel this is more of a muscle strain and a URI.  Patient looks very well and does not appear ill at all.  Do not feel that he needs further workup.  He is in agreement treatment plan.  Discharged stable condition with a work note, prescription for baclofen for muscle strain.      FINAL CLINICAL IMPRESSION(S) / ED DIAGNOSES   Final  diagnoses:  Muscle strain  Acute URI     Rx / DC Orders   ED Discharge Orders          Ordered    baclofen (LIORESAL) 10 MG tablet  3 times daily        10/18/22 1230             Note:  This document was prepared using Dragon voice recognition software and may include unintentional dictation errors.    Versie Starks, PA-C 10/18/22 1623    Arta Silence, MD 10/18/22 2004

## 2023-08-03 ENCOUNTER — Encounter: Payer: Self-pay | Admitting: Emergency Medicine

## 2023-08-03 ENCOUNTER — Other Ambulatory Visit: Payer: Self-pay

## 2023-08-03 ENCOUNTER — Emergency Department
Admission: EM | Admit: 2023-08-03 | Discharge: 2023-08-04 | Disposition: A | Discharge status: Home / Self Care | Payer: MEDICAID | Attending: Internal Medicine | Admitting: Internal Medicine

## 2023-08-03 ENCOUNTER — Emergency Department: Payer: MEDICAID

## 2023-08-03 DIAGNOSIS — R55 Syncope and collapse: Secondary | ICD-10-CM | POA: Diagnosis present

## 2023-08-03 DIAGNOSIS — R1013 Epigastric pain: Secondary | ICD-10-CM | POA: Diagnosis not present

## 2023-08-03 DIAGNOSIS — R079 Chest pain, unspecified: Secondary | ICD-10-CM | POA: Insufficient documentation

## 2023-08-03 DIAGNOSIS — K2921 Alcoholic gastritis with bleeding: Secondary | ICD-10-CM | POA: Diagnosis not present

## 2023-08-03 DIAGNOSIS — A419 Sepsis, unspecified organism: Secondary | ICD-10-CM | POA: Diagnosis present

## 2023-08-03 DIAGNOSIS — Y92410 Unspecified street and highway as the place of occurrence of the external cause: Secondary | ICD-10-CM | POA: Insufficient documentation

## 2023-08-03 DIAGNOSIS — J45909 Unspecified asthma, uncomplicated: Secondary | ICD-10-CM | POA: Insufficient documentation

## 2023-08-03 DIAGNOSIS — R61 Generalized hyperhidrosis: Secondary | ICD-10-CM | POA: Insufficient documentation

## 2023-08-03 DIAGNOSIS — E878 Other disorders of electrolyte and fluid balance, not elsewhere classified: Secondary | ICD-10-CM | POA: Diagnosis present

## 2023-08-03 DIAGNOSIS — Z720 Tobacco use: Secondary | ICD-10-CM | POA: Diagnosis present

## 2023-08-03 DIAGNOSIS — E8729 Other acidosis: Secondary | ICD-10-CM | POA: Diagnosis not present

## 2023-08-03 DIAGNOSIS — K7200 Acute and subacute hepatic failure without coma: Secondary | ICD-10-CM | POA: Diagnosis present

## 2023-08-03 DIAGNOSIS — K2920 Alcoholic gastritis without bleeding: Secondary | ICD-10-CM

## 2023-08-03 DIAGNOSIS — R519 Headache, unspecified: Secondary | ICD-10-CM | POA: Diagnosis not present

## 2023-08-03 DIAGNOSIS — K7010 Alcoholic hepatitis without ascites: Secondary | ICD-10-CM | POA: Insufficient documentation

## 2023-08-03 LAB — D-DIMER, QUANTITATIVE: D-Dimer, Quant: 0.37 ug{FEU}/mL (ref 0.00–0.50)

## 2023-08-03 LAB — CBC WITH DIFFERENTIAL/PLATELET
Abs Immature Granulocytes: 0.05 10*3/uL (ref 0.00–0.07)
Basophils Absolute: 0 10*3/uL (ref 0.0–0.1)
Basophils Relative: 0 %
Eosinophils Absolute: 0 10*3/uL (ref 0.0–0.5)
Eosinophils Relative: 0 %
HCT: 46.6 % (ref 39.0–52.0)
Hemoglobin: 15.4 g/dL (ref 13.0–17.0)
Immature Granulocytes: 0 %
Lymphocytes Relative: 11 %
Lymphs Abs: 1.3 10*3/uL (ref 0.7–4.0)
MCH: 30.1 pg (ref 26.0–34.0)
MCHC: 33 g/dL (ref 30.0–36.0)
MCV: 91.2 fL (ref 80.0–100.0)
Monocytes Absolute: 0.7 10*3/uL (ref 0.1–1.0)
Monocytes Relative: 6 %
Neutro Abs: 10.1 10*3/uL — ABNORMAL HIGH (ref 1.7–7.7)
Neutrophils Relative %: 83 %
Platelets: 291 10*3/uL (ref 150–400)
RBC: 5.11 MIL/uL (ref 4.22–5.81)
RDW: 13.2 % (ref 11.5–15.5)
WBC: 12.1 10*3/uL — ABNORMAL HIGH (ref 4.0–10.5)
nRBC: 0 % (ref 0.0–0.2)

## 2023-08-03 LAB — COMPREHENSIVE METABOLIC PANEL
ALT: 192 U/L — ABNORMAL HIGH (ref 0–44)
AST: 321 U/L — ABNORMAL HIGH (ref 15–41)
Albumin: 5.2 g/dL — ABNORMAL HIGH (ref 3.5–5.0)
Alkaline Phosphatase: 61 U/L (ref 38–126)
Anion gap: 24 — ABNORMAL HIGH (ref 5–15)
BUN: 15 mg/dL (ref 6–20)
CO2: 15 mmol/L — ABNORMAL LOW (ref 22–32)
Calcium: 9.2 mg/dL (ref 8.9–10.3)
Chloride: 95 mmol/L — ABNORMAL LOW (ref 98–111)
Creatinine, Ser: 0.97 mg/dL (ref 0.61–1.24)
GFR, Estimated: >60 mL/min (ref >60)
Glucose, Bld: 71 mg/dL (ref 70–99)
Potassium: 4.3 mmol/L (ref 3.5–5.1)
Sodium: 134 mmol/L — ABNORMAL LOW (ref 135–145)
Total Bilirubin: 1.9 mg/dL — ABNORMAL HIGH (ref <1.2)
Total Protein: 9 g/dL — ABNORMAL HIGH (ref 6.5–8.1)

## 2023-08-03 LAB — TROPONIN I (HIGH SENSITIVITY)
Troponin I (High Sensitivity): 11 ng/L (ref <18)
Troponin I (High Sensitivity): 13 ng/L (ref <18)

## 2023-08-03 LAB — URINALYSIS, ROUTINE W REFLEX MICROSCOPIC
Bacteria, UA: NONE SEEN
Bilirubin Urine: NEGATIVE
Glucose, UA: NEGATIVE mg/dL
Ketones, ur: 80 mg/dL — AB
Leukocytes,Ua: NEGATIVE
Nitrite: NEGATIVE
Protein, ur: 100 mg/dL — AB
Specific Gravity, Urine: 1.021 (ref 1.005–1.030)
pH: 5 (ref 5.0–8.0)

## 2023-08-03 LAB — PROTIME-INR
INR: 1 (ref 0.8–1.2)
Prothrombin Time: 13.8 s (ref 11.4–15.2)

## 2023-08-03 LAB — T4, FREE: Free T4: 0.57 ng/dL — ABNORMAL LOW (ref 0.61–1.12)

## 2023-08-03 LAB — BLOOD GAS, VENOUS
Acid-base deficit: 7.1 mmol/L — ABNORMAL HIGH (ref 0.0–2.0)
Bicarbonate: 16.8 mmol/L — ABNORMAL LOW (ref 20.0–28.0)
O2 Saturation: 97.9 %
Patient temperature: 37
pCO2, Ven: 29 mm[Hg] — ABNORMAL LOW (ref 44–60)
pH, Ven: 7.37 (ref 7.25–7.43)
pO2, Ven: 80 mm[Hg] — ABNORMAL HIGH (ref 32–45)

## 2023-08-03 LAB — AMMONIA: Ammonia: 19 umol/L (ref 9–35)

## 2023-08-03 LAB — URINE DRUG SCREEN, QUALITATIVE (ARMC ONLY)
Amphetamines, Ur Screen: NOT DETECTED
Barbiturates, Ur Screen: NOT DETECTED
Benzodiazepine, Ur Scrn: NOT DETECTED
Cannabinoid 50 Ng, Ur ~~LOC~~: POSITIVE — AB
Cocaine Metabolite,Ur ~~LOC~~: NOT DETECTED
MDMA (Ecstasy)Ur Screen: NOT DETECTED
Methadone Scn, Ur: NOT DETECTED
Opiate, Ur Screen: NOT DETECTED
Phencyclidine (PCP) Ur S: NOT DETECTED
Tricyclic, Ur Screen: NOT DETECTED

## 2023-08-03 LAB — PHOSPHORUS: Phosphorus: 2.4 mg/dL — ABNORMAL LOW (ref 2.5–4.6)

## 2023-08-03 LAB — LIPASE, BLOOD: Lipase: 43 U/L (ref 11–51)

## 2023-08-03 LAB — BETA-HYDROXYBUTYRIC ACID: Beta-Hydroxybutyric Acid: >8 mmol/L — ABNORMAL HIGH (ref 0.05–0.27)

## 2023-08-03 LAB — TSH: TSH: 2.531 u[IU]/mL (ref 0.350–4.500)

## 2023-08-03 LAB — MAGNESIUM: Magnesium: 2.4 mg/dL (ref 1.7–2.4)

## 2023-08-03 LAB — LACTIC ACID, PLASMA: Lactic Acid, Venous: 1.3 mmol/L (ref 0.5–1.9)

## 2023-08-03 LAB — ACETAMINOPHEN LEVEL: Acetaminophen (Tylenol), Serum: <10 ug/mL — ABNORMAL LOW (ref 10–30)

## 2023-08-03 LAB — TYPE AND SCREEN
ABO/RH(D): B POS
Antibody Screen: NEGATIVE

## 2023-08-03 LAB — PROCALCITONIN: Procalcitonin: <0.1 ng/mL

## 2023-08-03 LAB — ETHANOL: Alcohol, Ethyl (B): <10 mg/dL (ref <10)

## 2023-08-03 MED ORDER — FOLIC ACID 1 MG PO TABS
1.0000 mg | ORAL_TABLET | Freq: Every day | ORAL | Status: DC
  Administered 2023-08-03 – 2023-08-04 (×2): 1 mg via ORAL
  Filled 2023-08-03 (×2): qty 1

## 2023-08-03 MED ORDER — ACETAMINOPHEN 650 MG RE SUPP: 650.0000 mg | Freq: Four times a day (QID) | RECTAL | Status: DC | PRN

## 2023-08-03 MED ORDER — ACETAMINOPHEN 325 MG PO TABS: 650.0000 mg | ORAL_TABLET | Freq: Four times a day (QID) | ORAL | Status: DC | PRN

## 2023-08-03 MED ORDER — THIAMINE HCL 100 MG/ML IJ SOLN
500.0000 mg | Freq: Once | INTRAVENOUS | Status: AC
  Administered 2023-08-03: 500 mg via INTRAVENOUS
  Filled 2023-08-03: qty 5

## 2023-08-03 MED ORDER — IOHEXOL 300 MG/ML  SOLN
100.0000 mL | Freq: Once | INTRAMUSCULAR | Status: AC | PRN
  Administered 2023-08-03: 100 mL via INTRAVENOUS

## 2023-08-03 MED ORDER — ADULT MULTIVITAMIN W/MINERALS CH
1.0000 | ORAL_TABLET | Freq: Every day | ORAL | Status: DC
  Administered 2023-08-03 – 2023-08-04 (×2): 1 via ORAL
  Filled 2023-08-03 (×2): qty 1

## 2023-08-03 MED ORDER — THIAMINE MONONITRATE 100 MG PO TABS
100.0000 mg | ORAL_TABLET | Freq: Every day | ORAL | Status: DC
Start: 2023-08-03 — End: 2023-08-03
  Administered 2023-08-03: 100 mg via ORAL
  Filled 2023-08-03: qty 1

## 2023-08-03 MED ORDER — THIAMINE HCL 100 MG/ML IJ SOLN
100.0000 mg | Freq: Every day | INTRAMUSCULAR | Status: DC
  Filled 2023-08-03: qty 2

## 2023-08-03 MED ORDER — PANTOPRAZOLE SODIUM 40 MG IV SOLR
40.0000 mg | Freq: Two times a day (BID) | INTRAVENOUS | Status: DC
  Administered 2023-08-03 – 2023-08-04 (×2): 40 mg via INTRAVENOUS
  Filled 2023-08-03 (×3): qty 10

## 2023-08-03 MED ORDER — SODIUM CHLORIDE 0.9 % IV BOLUS
500.0000 mL | Freq: Once | INTRAVENOUS | Status: AC
  Administered 2023-08-03: 500 mL via INTRAVENOUS

## 2023-08-03 MED ORDER — ACETAMINOPHEN 325 MG PO TABS
650.0000 mg | ORAL_TABLET | Freq: Once | ORAL | Status: AC
  Administered 2023-08-03: 650 mg via ORAL
  Filled 2023-08-03: qty 2

## 2023-08-03 MED ORDER — HYDROCODONE-ACETAMINOPHEN 5-325 MG PO TABS: 1.0000 | ORAL_TABLET | ORAL | Status: DC | PRN

## 2023-08-03 MED ORDER — SODIUM CHLORIDE 0.9% FLUSH: 3.0000 mL | Freq: Two times a day (BID) | INTRAVENOUS | Status: DC

## 2023-08-03 MED ORDER — LORAZEPAM 2 MG/ML IJ SOLN: 0.0000 mg | Freq: Two times a day (BID) | INTRAMUSCULAR | Status: DC

## 2023-08-03 MED ORDER — NICOTINE 14 MG/24HR TD PT24
14.0000 mg | MEDICATED_PATCH | Freq: Every day | TRANSDERMAL | Status: DC
  Administered 2023-08-04: 14 mg via TRANSDERMAL
  Filled 2023-08-03: qty 1

## 2023-08-03 MED ORDER — SODIUM CHLORIDE 0.9 % IV SOLN: INTRAVENOUS | Status: DC

## 2023-08-03 MED ORDER — LORAZEPAM 2 MG/ML IJ SOLN
0.0000 mg | Freq: Four times a day (QID) | INTRAMUSCULAR | Status: DC
  Administered 2023-08-03: 1 mg via INTRAVENOUS
  Filled 2023-08-03: qty 1

## 2023-08-03 MED ORDER — HEPARIN SODIUM (PORCINE) 5000 UNIT/ML IJ SOLN
5000.0000 [IU] | Freq: Three times a day (TID) | INTRAMUSCULAR | Status: DC
  Administered 2023-08-04: 5000 [IU] via SUBCUTANEOUS
  Filled 2023-08-03: qty 1

## 2023-08-03 MED ORDER — MORPHINE SULFATE (PF) 2 MG/ML IV SOLN
2.0000 mg | INTRAVENOUS | Status: DC | PRN
  Administered 2023-08-04: 2 mg via INTRAVENOUS
  Filled 2023-08-03: qty 1

## 2023-08-03 MED ORDER — METOCLOPRAMIDE HCL 10 MG PO TABS
10.0000 mg | ORAL_TABLET | Freq: Once | ORAL | Status: AC
  Administered 2023-08-03: 10 mg via ORAL
  Filled 2023-08-03: qty 1
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
## 2023-08-03 NOTE — ED Notes (Signed)
Pt ambulated independently from ED 46 to ED 35. Pt provided gown, non-slip socks and ice chips. Bed is low and locked, pt in NAD, call bell within reach. No other needs stated at this time.
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
## 2023-08-03 NOTE — H&P (Signed)
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 Refer to ICU, Med-Surg, Progressive, and Step-Down Mobility Protocol Sidebars   Daily weights   Intake and Output   Do not place and if present remove PureWick   Initiate Oral Care Protocol   Initiate Carrier Fluid Protocol   RN may order General Admission PRN Orders utilizing "General Admission PRN medications" (through manage orders) for the following patient needs: allergy symptoms (Claritin), cold sores (Carmex), cough (Robitussin DM), eye irritation (Liquifilm Tears), hemorrhoids (Tucks), indigestion (Maalox), minor skin irritation (Hydrocortisone Cream), muscle pain Romeo Apple Gay), nose irritation (saline nasal spray) and sore throat (Chloraseptic spray).   Cardiac Monitoring Continuous x 48 hours  Indications for use: Other; Other indications for use: alcohol abuse   Patient has an active order for admit to inpatient/place in observation   Vital signs   Cardiac monitoring   Mobility Protocol: No Restrictions   Refer to Sidebar Report Refer to  ICU, Med-Surg, Progressive, and Step-Down Mobility Protocol Sidebars   Apply Diabetes Mellitus Care Plan   Apply Diabetic Ketoacidosis Care Plan   Notify physician (specify)   If present, discontinue Insulin Pump after IV Insulin is initiated.   Do NOT use lab glucose values in [employer].  If CBG meter reads "Critical High", enter 600.   Strict intake and output   Upon IV fluid bolus completion, place order for STAT BMET (LAB15) and call provider with results.   IV bolus already initiated   K+ > 5 mEq/L and/or K+ addressed separately   Full code   Consult to hospitalist   Consult to Transition of Care Team   Consult to Transition of Care Team   Pulse oximetry check with vital signs   Oxygen therapy Mode or (Route): Nasal cannula; Liters Per Minute: 2; Keep O2 saturation between: greater than 92 %   CBG monitoring, ED   EKG 12-Lead   Type and screen   Saline lock IV   Admit to Inpatient (patient's expected length of stay will be greater than 2 midnights or inpatient only procedure)   Aspiration precautions   Seizure precautions   Fall precautions
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
## 2023-08-04 DIAGNOSIS — K7010 Alcoholic hepatitis without ascites: Secondary | ICD-10-CM | POA: Insufficient documentation

## 2023-08-04 DIAGNOSIS — E8729 Other acidosis: Secondary | ICD-10-CM | POA: Diagnosis not present

## 2023-08-04 DIAGNOSIS — Z720 Tobacco use: Secondary | ICD-10-CM | POA: Diagnosis present

## 2023-08-04 LAB — HEPATITIS PANEL, ACUTE
HCV Ab: NONREACTIVE
Hep A IgM: NONREACTIVE
Hep B C IgM: NONREACTIVE
Hepatitis B Surface Ag: NONREACTIVE

## 2023-08-04 LAB — BASIC METABOLIC PANEL
Anion gap: 12 (ref 5–15)
BUN: 10 mg/dL (ref 6–20)
CO2: 19 mmol/L — ABNORMAL LOW (ref 22–32)
Calcium: 8.7 mg/dL — ABNORMAL LOW (ref 8.9–10.3)
Chloride: 101 mmol/L (ref 98–111)
Creatinine, Ser: 0.89 mg/dL (ref 0.61–1.24)
GFR, Estimated: >60 mL/min (ref >60)
Glucose, Bld: 119 mg/dL — ABNORMAL HIGH (ref 70–99)
Potassium: 3.5 mmol/L (ref 3.5–5.1)
Sodium: 132 mmol/L — ABNORMAL LOW (ref 135–145)

## 2023-08-04 LAB — CBC
HCT: 40 % (ref 39.0–52.0)
Hemoglobin: 13.7 g/dL (ref 13.0–17.0)
MCH: 30.2 pg (ref 26.0–34.0)
MCHC: 34.3 g/dL (ref 30.0–36.0)
MCV: 88.3 fL (ref 80.0–100.0)
Platelets: 226 10*3/uL (ref 150–400)
RBC: 4.53 MIL/uL (ref 4.22–5.81)
RDW: 13.3 % (ref 11.5–15.5)
WBC: 9.7 10*3/uL (ref 4.0–10.5)
nRBC: 0 % (ref 0.0–0.2)

## 2023-08-04 LAB — COMPREHENSIVE METABOLIC PANEL
ALT: 137 U/L — ABNORMAL HIGH (ref 0–44)
AST: 160 U/L — ABNORMAL HIGH (ref 15–41)
Albumin: 4.4 g/dL (ref 3.5–5.0)
Alkaline Phosphatase: 53 U/L (ref 38–126)
Anion gap: 14 (ref 5–15)
BUN: 11 mg/dL (ref 6–20)
CO2: 16 mmol/L — ABNORMAL LOW (ref 22–32)
Calcium: 8.9 mg/dL (ref 8.9–10.3)
Chloride: 102 mmol/L (ref 98–111)
Creatinine, Ser: 0.95 mg/dL (ref 0.61–1.24)
GFR, Estimated: >60 mL/min (ref >60)
Glucose, Bld: 73 mg/dL (ref 70–99)
Potassium: 4.4 mmol/L (ref 3.5–5.1)
Sodium: 132 mmol/L — ABNORMAL LOW (ref 135–145)
Total Bilirubin: 1.7 mg/dL — ABNORMAL HIGH (ref <1.2)
Total Protein: 7.8 g/dL (ref 6.5–8.1)

## 2023-08-04 LAB — GAMMA GT: GGT: 139 U/L — ABNORMAL HIGH (ref 7–50)

## 2023-08-04 LAB — HEMOGLOBIN A1C
Hgb A1c MFr Bld: 5.5 % (ref 4.8–5.6)
Mean Plasma Glucose: 111.15 mg/dL

## 2023-08-04 LAB — HIV ANTIBODY (ROUTINE TESTING W REFLEX): HIV Screen 4th Generation wRfx: NONREACTIVE

## 2023-08-04 LAB — CBG MONITORING, ED: Glucose-Capillary: 85 mg/dL (ref 70–99)

## 2023-08-04 MED ORDER — DEXTROSE-SODIUM CHLORIDE 5-0.45 % IV SOLN: INTRAVENOUS | Status: AC

## 2023-08-04 MED ORDER — LACTATED RINGERS IV BOLUS
1000.0000 mL | Freq: Once | INTRAVENOUS | Status: AC
  Administered 2023-08-04: 1000 mL via INTRAVENOUS

## 2023-08-04 MED ORDER — DEXTROSE IN LACTATED RINGERS 5 % IV SOLN: INTRAVENOUS | Status: DC

## 2023-08-04 MED ORDER — THIAMINE MONONITRATE 100 MG PO TABS
100.0000 mg | ORAL_TABLET | Freq: Every day | ORAL | Status: DC
  Administered 2023-08-04: 100 mg via ORAL
  Filled 2023-08-04: qty 1

## 2023-08-04 MED ORDER — DEXTROSE 50 % IV SOLN: 0.0000 mL | INTRAVENOUS | Status: DC | PRN

## 2023-08-04 MED ORDER — INSULIN REGULAR(HUMAN) IN NACL 100-0.9 UT/100ML-% IV SOLN: INTRAVENOUS | Status: DC

## 2023-08-04 NOTE — Assessment & Plan Note (Signed)
Nicotine patch.  Counselling once pt's condition is stable.

## 2023-08-04 NOTE — Assessment & Plan Note (Signed)
Patient meet sepsis criteria with white count vitals and presentation unclear suspected sepsis will follow culture sensitivity and procalcitonin and will consider antibiotic therapy as we do not have a clinical source

## 2023-08-04 NOTE — ED Notes (Signed)
Patient Alert and oriented to baseline. Stable and ambulatory to baseline. Patient verbalized understanding of the discharge instructions.  Patient belongings were taken by the patient.   

## 2023-08-04 NOTE — Assessment & Plan Note (Addendum)
Initially I thought the high anion gap metabolic acidosis may be from underlying diabetes unknown to the patient and started the insulin drip which we did not initiate because most likely in his case it is probably related to his alcohol. We will however check his A1c and make sure that he is not a diabetic and in the meantime start for fluid hydration. Correct for dehydration and electrolyte replacement.
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
## 2023-08-04 NOTE — ED Notes (Signed)
Pt in NAD, denies any complaints at this time.
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
## 2023-08-04 NOTE — Assessment & Plan Note (Signed)
Electrolyte pharmacy consult.

## 2023-08-05 LAB — HEMOGLOBIN A1C
Hgb A1c MFr Bld: 5.6 % (ref 4.8–5.6)
Mean Plasma Glucose: 114 mg/dL

## 2023-08-08 LAB — CULTURE, BLOOD (ROUTINE X 2)
Culture: NO GROWTH
Culture: NO GROWTH
Special Requests: ADEQUATE
